# Patient Record
Sex: Male | Born: 1935
Health system: Southern US, Community
[De-identification: ages and names within clinical notes are randomized; demographics above are authoritative.]

## PROBLEM LIST (undated history)

## (undated) DIAGNOSIS — I5022 Chronic systolic (congestive) heart failure: Secondary | ICD-10-CM

## (undated) DIAGNOSIS — E079 Disorder of thyroid, unspecified: Secondary | ICD-10-CM

## (undated) DIAGNOSIS — I10 Essential (primary) hypertension: Secondary | ICD-10-CM

## (undated) DIAGNOSIS — N159 Renal tubulo-interstitial disease, unspecified: Secondary | ICD-10-CM

## (undated) HISTORY — PX: CHOLECYSTECTOMY: SHX55

---

## 2002-04-04 ENCOUNTER — Observation Stay (HOSPITAL_COMMUNITY): Admission: RE | Admit: 2002-04-04 | Discharge: 2002-04-06 | Payer: Self-pay | Admitting: General Surgery

## 2005-11-08 ENCOUNTER — Emergency Department (HOSPITAL_COMMUNITY): Admission: EM | Admit: 2005-11-08 | Discharge: 2005-11-08 | Payer: Self-pay | Admitting: Emergency Medicine

## 2013-05-28 ENCOUNTER — Inpatient Hospital Stay (HOSPITAL_COMMUNITY)
Admission: EM | Admit: 2013-05-28 | Discharge: 2013-06-02 | DRG: 871 | Disposition: A | Discharge status: Home Health | Payer: Medicare Other | Attending: Internal Medicine | Admitting: Internal Medicine

## 2013-05-28 ENCOUNTER — Encounter (HOSPITAL_COMMUNITY): Payer: Self-pay

## 2013-05-28 ENCOUNTER — Inpatient Hospital Stay (HOSPITAL_COMMUNITY): Payer: Medicare Other

## 2013-05-28 ENCOUNTER — Emergency Department (HOSPITAL_COMMUNITY): Payer: Medicare Other

## 2013-05-28 ENCOUNTER — Other Ambulatory Visit: Payer: Self-pay

## 2013-05-28 DIAGNOSIS — R17 Unspecified jaundice: Secondary | ICD-10-CM | POA: Diagnosis present

## 2013-05-28 DIAGNOSIS — N184 Chronic kidney disease, stage 4 (severe): Secondary | ICD-10-CM | POA: Diagnosis present

## 2013-05-28 DIAGNOSIS — E876 Hypokalemia: Secondary | ICD-10-CM | POA: Diagnosis not present

## 2013-05-28 DIAGNOSIS — E872 Acidosis, unspecified: Secondary | ICD-10-CM | POA: Diagnosis not present

## 2013-05-28 DIAGNOSIS — I4891 Unspecified atrial fibrillation: Secondary | ICD-10-CM | POA: Diagnosis present

## 2013-05-28 DIAGNOSIS — R1011 Right upper quadrant pain: Secondary | ICD-10-CM

## 2013-05-28 DIAGNOSIS — I079 Rheumatic tricuspid valve disease, unspecified: Secondary | ICD-10-CM | POA: Diagnosis present

## 2013-05-28 DIAGNOSIS — N179 Acute kidney failure, unspecified: Secondary | ICD-10-CM | POA: Diagnosis present

## 2013-05-28 DIAGNOSIS — B961 Klebsiella pneumoniae [K. pneumoniae] as the cause of diseases classified elsewhere: Secondary | ICD-10-CM | POA: Diagnosis present

## 2013-05-28 DIAGNOSIS — N390 Urinary tract infection, site not specified: Secondary | ICD-10-CM | POA: Diagnosis present

## 2013-05-28 DIAGNOSIS — K810 Acute cholecystitis: Secondary | ICD-10-CM

## 2013-05-28 DIAGNOSIS — D72829 Elevated white blood cell count, unspecified: Secondary | ICD-10-CM

## 2013-05-28 DIAGNOSIS — I129 Hypertensive chronic kidney disease with stage 1 through stage 4 chronic kidney disease, or unspecified chronic kidney disease: Secondary | ICD-10-CM | POA: Diagnosis present

## 2013-05-28 DIAGNOSIS — I452 Bifascicular block: Secondary | ICD-10-CM | POA: Diagnosis present

## 2013-05-28 DIAGNOSIS — A4150 Gram-negative sepsis, unspecified: Principal | ICD-10-CM | POA: Diagnosis present

## 2013-05-28 DIAGNOSIS — A419 Sepsis, unspecified organism: Secondary | ICD-10-CM | POA: Diagnosis present

## 2013-05-28 DIAGNOSIS — E039 Hypothyroidism, unspecified: Secondary | ICD-10-CM | POA: Diagnosis present

## 2013-05-28 DIAGNOSIS — I5022 Chronic systolic (congestive) heart failure: Secondary | ICD-10-CM | POA: Diagnosis present

## 2013-05-28 DIAGNOSIS — Z79899 Other long term (current) drug therapy: Secondary | ICD-10-CM

## 2013-05-28 DIAGNOSIS — I959 Hypotension, unspecified: Secondary | ICD-10-CM

## 2013-05-28 DIAGNOSIS — I10 Essential (primary) hypertension: Secondary | ICD-10-CM | POA: Diagnosis present

## 2013-05-28 DIAGNOSIS — K819 Cholecystitis, unspecified: Secondary | ICD-10-CM

## 2013-05-28 DIAGNOSIS — R7881 Bacteremia: Secondary | ICD-10-CM | POA: Diagnosis present

## 2013-05-28 HISTORY — DX: Disorder of thyroid, unspecified: E07.9

## 2013-05-28 HISTORY — DX: Essential (primary) hypertension: I10

## 2013-05-28 LAB — GLUCOSE, CAPILLARY
Glucose-Capillary: 115 mg/dL — ABNORMAL HIGH (ref 70–99)
Glucose-Capillary: 119 mg/dL — ABNORMAL HIGH (ref 70–99)

## 2013-05-28 LAB — CBC WITH DIFFERENTIAL/PLATELET
Basophils Absolute: 0 10*3/uL (ref 0.0–0.1)
Basophils Relative: 0 % (ref 0–1)
Eosinophils Relative: 0 % (ref 0–5)
Lymphocytes Relative: 2 % — ABNORMAL LOW (ref 12–46)
MCHC: 34.6 g/dL (ref 30.0–36.0)
MCV: 91.6 fL (ref 78.0–100.0)
Monocytes Absolute: 1.1 10*3/uL — ABNORMAL HIGH (ref 0.1–1.0)
Monocytes Relative: 7 % (ref 3–12)
Neutro Abs: 14.9 10*3/uL — ABNORMAL HIGH (ref 1.7–7.7)
Platelets: 214 10*3/uL (ref 150–400)
RDW: 13.9 % (ref 11.5–15.5)
WBC: 16.3 10*3/uL — ABNORMAL HIGH (ref 4.0–10.5)

## 2013-05-28 LAB — URINE MICROSCOPIC-ADD ON

## 2013-05-28 LAB — TYPE AND SCREEN: Antibody Screen: NEGATIVE

## 2013-05-28 LAB — COMPREHENSIVE METABOLIC PANEL
ALT: 36 U/L (ref 0–53)
AST: 65 U/L — ABNORMAL HIGH (ref 0–37)
Albumin: 2.9 g/dL — ABNORMAL LOW (ref 3.5–5.2)
BUN: 34 mg/dL — ABNORMAL HIGH (ref 6–23)
BUN: 33 mg/dL — ABNORMAL HIGH (ref 6–23)
CO2: 25 mEq/L (ref 19–32)
CO2: 16 mEq/L — ABNORMAL LOW (ref 19–32)
Calcium: 8.8 mg/dL (ref 8.4–10.5)
Calcium: 7.7 mg/dL — ABNORMAL LOW (ref 8.4–10.5)
Chloride: 104 mEq/L (ref 96–112)
Chloride: 106 mEq/L (ref 96–112)
Creatinine, Ser: 1.89 mg/dL — ABNORMAL HIGH (ref 0.50–1.35)
Creatinine, Ser: 1.46 mg/dL — ABNORMAL HIGH (ref 0.50–1.35)
GFR calc Af Amer: 47 mL/min — ABNORMAL LOW (ref >90)
GFR calc non Af Amer: 30 mL/min — ABNORMAL LOW (ref >90)
GFR calc non Af Amer: 41 mL/min — ABNORMAL LOW (ref >90)
Glucose, Bld: 106 mg/dL — ABNORMAL HIGH (ref 70–99)
Glucose, Bld: 121 mg/dL — ABNORMAL HIGH (ref 70–99)
Sodium: 138 mEq/L (ref 135–145)
Total Bilirubin: 2.6 mg/dL — ABNORMAL HIGH (ref 0.3–1.2)
Total Bilirubin: 2.1 mg/dL — ABNORMAL HIGH (ref 0.3–1.2)
Total Protein: 5.9 g/dL — ABNORMAL LOW (ref 6.0–8.3)

## 2013-05-28 LAB — URINALYSIS, ROUTINE W REFLEX MICROSCOPIC
Bilirubin Urine: NEGATIVE
Glucose, UA: NEGATIVE mg/dL
Nitrite: NEGATIVE
Specific Gravity, Urine: >1.03 — ABNORMAL HIGH (ref 1.005–1.030)
Urobilinogen, UA: 0.2 mg/dL (ref 0.0–1.0)
pH: 5.5 (ref 5.0–8.0)

## 2013-05-28 LAB — CBC
HCT: 35.8 % — ABNORMAL LOW (ref 39.0–52.0)
Hemoglobin: 12.5 g/dL — ABNORMAL LOW (ref 13.0–17.0)
MCH: 31.5 pg (ref 26.0–34.0)
MCV: 90.2 fL (ref 78.0–100.0)
RBC: 3.97 MIL/uL — ABNORMAL LOW (ref 4.22–5.81)
WBC: 27.1 10*3/uL — ABNORMAL HIGH (ref 4.0–10.5)

## 2013-05-28 LAB — MAGNESIUM: Magnesium: 1.4 mg/dL — ABNORMAL LOW (ref 1.5–2.5)

## 2013-05-28 LAB — TROPONIN I: Troponin I: <0.3 ng/mL (ref <0.30)

## 2013-05-28 LAB — ABO/RH: ABO/RH(D): B POS

## 2013-05-28 LAB — LACTIC ACID, PLASMA
Lactic Acid, Venous: 2.4 mmol/L — ABNORMAL HIGH (ref 0.5–2.2)
Lactic Acid, Venous: 1.7 mmol/L (ref 0.5–2.2)

## 2013-05-28 LAB — FIBRINOGEN: Fibrinogen: 585 mg/dL — ABNORMAL HIGH (ref 204–475)

## 2013-05-28 LAB — PRO B NATRIURETIC PEPTIDE: Pro B Natriuretic peptide (BNP): 5066 pg/mL — ABNORMAL HIGH (ref 0–450)

## 2013-05-28 MED ORDER — IOHEXOL 300 MG/ML  SOLN
50.0000 mL | Freq: Once | INTRAMUSCULAR | Status: AC | PRN
  Administered 2013-05-28: 30 mL via INTRAVENOUS

## 2013-05-28 MED ORDER — PIPERACILLIN-TAZOBACTAM 3.375 G IVPB
3.3750 g | Freq: Once | INTRAVENOUS | Status: AC
  Administered 2013-05-28: 3.375 g via INTRAVENOUS
  Filled 2013-05-28: qty 50

## 2013-05-28 MED ORDER — ALBUTEROL SULFATE (5 MG/ML) 0.5% IN NEBU: 2.5000 mg | INHALATION_SOLUTION | RESPIRATORY_TRACT | Status: DC | PRN

## 2013-05-28 MED ORDER — ACETAMINOPHEN 325 MG PO TABS
ORAL_TABLET | ORAL | Status: AC
  Filled 2013-05-28: qty 2

## 2013-05-28 MED ORDER — VANCOMYCIN HCL IN DEXTROSE 1-5 GM/200ML-% IV SOLN
1000.0000 mg | Freq: Once | INTRAVENOUS | Status: DC
  Filled 2013-05-28: qty 200

## 2013-05-28 MED ORDER — VANCOMYCIN HCL IN DEXTROSE 1-5 GM/200ML-% IV SOLN
1000.0000 mg | Freq: Once | INTRAVENOUS | Status: AC
  Administered 2013-05-28: 1000 mg via INTRAVENOUS
  Filled 2013-05-28: qty 200

## 2013-05-28 MED ORDER — NOREPINEPHRINE BITARTRATE 1 MG/ML IJ SOLN
2.0000 ug/min | INTRAVENOUS | Status: DC
  Administered 2013-05-28: 2 ug/min via INTRAVENOUS
  Filled 2013-05-28: qty 4

## 2013-05-28 MED ORDER — ACETAMINOPHEN 325 MG PO TABS: 325.0000 mg | ORAL_TABLET | ORAL | Status: DC | PRN

## 2013-05-28 MED ORDER — SODIUM CHLORIDE 0.9 % IV SOLN
1000.0000 mL | Freq: Once | INTRAVENOUS | Status: AC
  Administered 2013-05-28: 1000 mL via INTRAVENOUS

## 2013-05-28 MED ORDER — HYDROCORTISONE SOD SUCCINATE 100 MG IJ SOLR
50.0000 mg | Freq: Four times a day (QID) | INTRAMUSCULAR | Status: DC
  Administered 2013-05-28 – 2013-05-29 (×4): 50 mg via INTRAVENOUS
  Filled 2013-05-28 (×8): qty 1

## 2013-05-28 MED ORDER — FENTANYL CITRATE 0.05 MG/ML IJ SOLN
INTRAMUSCULAR | Status: AC | PRN
  Administered 2013-05-28 (×2): 12.5 ug via INTRAVENOUS

## 2013-05-28 MED ORDER — MIDAZOLAM HCL 2 MG/2ML IJ SOLN
INTRAMUSCULAR | Status: AC
  Filled 2013-05-28: qty 4

## 2013-05-28 MED ORDER — MIDAZOLAM HCL 2 MG/2ML IJ SOLN
INTRAMUSCULAR | Status: AC | PRN
  Administered 2013-05-28 (×2): 0.5 mg via INTRAVENOUS
  Administered 2013-05-28: 1 mg via INTRAVENOUS

## 2013-05-28 MED ORDER — PIPERACILLIN-TAZOBACTAM 3.375 G IVPB 30 MIN
3.3750 g | Freq: Once | INTRAVENOUS | Status: AC
  Administered 2013-05-28: 3.375 g via INTRAVENOUS
  Filled 2013-05-28: qty 50

## 2013-05-28 MED ORDER — PIPERACILLIN-TAZOBACTAM 3.375 G IVPB
3.3750 g | Freq: Three times a day (TID) | INTRAVENOUS | Status: DC
  Administered 2013-05-28 – 2013-05-29 (×2): 3.375 g via INTRAVENOUS
  Filled 2013-05-28 (×5): qty 50

## 2013-05-28 MED ORDER — DEXTROSE 5 % IV SOLN
5.0000 ug/min | INTRAVENOUS | Status: DC
  Filled 2013-05-28: qty 4

## 2013-05-28 MED ORDER — FENTANYL CITRATE 0.05 MG/ML IJ SOLN
INTRAMUSCULAR | Status: AC
  Filled 2013-05-28: qty 4

## 2013-05-28 MED ORDER — ACETAMINOPHEN 325 MG PO TABS
650.0000 mg | ORAL_TABLET | Freq: Once | ORAL | Status: AC
  Administered 2013-05-28: 650 mg via ORAL

## 2013-05-28 MED ORDER — VANCOMYCIN HCL 500 MG IV SOLR
500.0000 mg | INTRAVENOUS | Status: AC
  Administered 2013-05-28: 500 mg via INTRAVENOUS
  Filled 2013-05-28 (×2): qty 500

## 2013-05-28 MED ORDER — SODIUM CHLORIDE 0.9 % IV BOLUS (SEPSIS): 1000.0000 mL | INTRAVENOUS | Status: DC | PRN

## 2013-05-28 MED ORDER — SODIUM CHLORIDE 0.9 % IV SOLN
1000.0000 mL | INTRAVENOUS | Status: DC
  Administered 2013-05-28 – 2013-05-29 (×2): 1000 mL via INTRAVENOUS

## 2013-05-28 MED ORDER — FENTANYL CITRATE 0.05 MG/ML IJ SOLN
25.0000 ug | INTRAMUSCULAR | Status: DC | PRN
  Administered 2013-05-28: 25 ug via INTRAVENOUS
  Administered 2013-05-29: 50 ug via INTRAVENOUS
  Filled 2013-05-28 (×2): qty 2

## 2013-05-28 MED ORDER — SODIUM CHLORIDE 0.9 % IV BOLUS (SEPSIS)
2000.0000 mL | Freq: Once | INTRAVENOUS | Status: AC
  Administered 2013-05-28: 2000 mL via INTRAVENOUS

## 2013-05-28 MED ORDER — VANCOMYCIN HCL 10 G IV SOLR
1250.0000 mg | INTRAVENOUS | Status: DC
  Administered 2013-05-29: 1250 mg via INTRAVENOUS
  Filled 2013-05-28 (×2): qty 1250

## 2013-05-28 NOTE — H&P (Signed)
PULMONARY  / CRITICAL CARE MEDICINE  Name: Drew Reed MRN: [id number] DOB: 05-Nov-1919    ADMISSION DATE:  05/28/2013 CONSULTATION DATE:  05/28/2013  REFERRING MD :  Jola Schmidt PRIMARY SERVICE: Critical care  CHIEF COMPLAINT:  Weakness  BRIEF PATIENT DESCRIPTION: Drew Reed is a 79 y/o male transferred from [facility] for sepsis likely due to acute cholecystitis and UTI.  SIGNIFICANT EVENTS / STUDIES:  10/1- transferred to [facility] MICU 10/1 EKG with RBB and new-onset afib 10/1 Started on levophed  LINES / TUBES: L IJ 10/1>>> Foley catheter 10/1>>>  CULTURES: Blood Cx X 2 10/1>>> Urine Culture 10/1>>>  ANTIBIOTICS: Vancomycin 10/1>>> Zosyn 10/1>>>  HISTORY OF PRESENT ILLNESS:  Drew Reed was taken to the [facility] by EMS after 3-4 days of generalized weakness, fatigue, nausea, and abdominal pain. On his first BP by EMS he he had systolic BP of 79. In the ED he was found to have new onset Afib and a persistently low BP unresponsive to fluids. He was started on pressors and a CT abdomen found evidence of acute cholecystitis. He was felt to be a poor surgical candidate needing IR placement of a perc drainage so was transferred hear for higher level of care.   He denies chest pain, dyspnea,  Confusion, weakness, or syncope. He notes that he had chills last night. He takes several pills daily and has not taken them for 3-4 days due to not feeling well. He has been checking his BP daily and it has been lower than usual at the 100s/70s. He notes decreased appetite and a few episodes of non-bloody diarrhea.   PAST MEDICAL HISTORY :  Past Medical History  Diagnosis Date  . Hypertension   . Thyroid disease    History reviewed. No pertinent past surgical history. Prior to Admission medications   Medication Sig Start Date End Date Taking? Authorizing Provider  finasteride (PROSCAR) 5 MG tablet Take 5 mg by mouth daily.   Yes Historical Provider, MD  levothyroxine  (SYNTHROID, LEVOTHROID) 25 MCG tablet Take 25 mcg by mouth daily before breakfast.   Yes Historical Provider, MD  lisinopril (PRINIVIL,ZESTRIL) 5 MG tablet Take 2.5 mg by mouth daily.   Yes Historical Provider, MD  metoprolol tartrate (LOPRESSOR) 25 MG tablet Take 25 mg by mouth 2 (two) times daily. Hold if systolic pressure is less than 130.   Yes Historical Provider, MD  tamsulosin (FLOMAX) 0.4 MG CAPS capsule Take 0.4 mg by mouth daily.   Yes Historical Provider, MD   No Known Allergies  FAMILY HISTORY:  No family history on file. SOCIAL HISTORY:  reports that he has never smoked. He does not have any smokeless tobacco history on file. He reports that he does not drink alcohol or use illicit drugs.  REVIEW OF SYSTEMS:   Per HPI  SUBJECTIVE: Reports that besides the weakness he feels well, he is feeling better since presentation to teh ED this am.   VITAL SIGNS: Temp:  [98.2 F (36.8 C)-103.2 F (39.6 C)] 98.4 F (36.9 C) (10/01 1443) Pulse Rate:  [76-109] 79 (10/01 1332) Resp:  [12-32] 26 (10/01 1332) BP: (72-99)/(42-59) 80/57 mmHg (10/01 1450) SpO2:  [92 %-97 %] 94 % (10/01 1332) Weight:  [197 lb 5 oz (89.5 kg)] 197 lb 5 oz (89.5 kg) (10/01 1450) HEMODYNAMICS:    VENTILATOR SETTINGS: N/A   INTAKE / OUTPUT: Intake/Output     09/30 0701 - 10/01 0700 10/01 0701 - 10/02 0700   I.V. (  mL/kg)  6.9 (0.1)   Total Intake(mL/kg)  6.9 (0.1)   Net   +6.9          PHYSICAL EXAMINATION: General:  NAD, alert and interactive Neuro:  Strength 5/5 and sensation intact in all 4 extremities, CN 2-12 intact HEENT:  MMM Cardiovascular:  Irregularly irregular rhythm, no murmur, distant heart sounds as he is barrel chested Lungs:  CTAB Abdomen:  Soft, tender to palpation of RUQ and epigastrium, rebound tenderness in RUQ Musculoskeletal:  No edema, cool feet BL Skin:  Intact without evident lesions  LABS:  CBC Recent Labs     05/28/13  0958  WBC  16.3*  HGB  13.6  HCT  39.3  PLT   214   Coag's No results found for this basename: APTT, INR,  in the last 72 hours BMET Recent Labs     05/28/13  0958  NA  138  K  3.2*  CL  104  CO2  25  BUN  34*  CREATININE  1.89*  GLUCOSE  106*   Electrolytes Recent Labs     05/28/13  0958  CALCIUM  8.8   Sepsis Markers Recent Labs     05/28/13  0958  PROCALCITON  68.49   ABG No results found for this basename: PHART, PCO2ART, PO2ART,  in the last 72 hours Liver Enzymes Recent Labs     05/28/13  0958  AST  65*  ALT  36  ALKPHOS  84  BILITOT  2.6*  ALBUMIN  2.9*   Cardiac Enzymes Recent Labs     05/28/13  0958  TROPONINI  <0.30   Glucose No results found for this basename: GLUCAP,  in the last 72 hours  Imaging Ct Abdomen Pelvis Wo Contrast  05/28/2013   CLINICAL DATA:  Septate, weakness, fatigue, history hypertension  EXAM: CT ABDOMEN AND PELVIS WITHOUT CONTRAST  TECHNIQUE: Multidetector CT imaging of the abdomen and pelvis was performed following the standard protocol without intravenous contrast. Sagittal and coronal MPR images reconstructed from axial data set. Oral contrast not administered.  COMPARISON:  None  FINDINGS: Minimal bibasilar atelectasis.  Two right lung base nodules, 7 mm diameter image 10 and 11 mm diameter image 11.  Distended gallbladder with thickened wall and pericholecystic edema highly suspicious for acute cholecystitis.  No definite biliary tract calcifications identified.  Calcified granulomata within spleen.  Tiny calcified granuloma at upper pole right kidney image 23.  Within limits of a nonenhanced exam, no additional focal abnormalities of the liver, spleen, pancreas, kidneys, or adrenal glands otherwise identified.  Supraumbilical ventral hernia containing fat.  Right inguinal hernia containing nonobstructed segment of sigmoid colon.  Scattered mild colonic diverticulosis distally.  Atherosclerotic calcifications including coronary arteries.  Stomach and bowel loops otherwise  normal appearance.  Bladder decompressed by Foley catheter with enlarged prostate gland noted.  No mass, adenopathy, free fluid, or free air.  Bones demineralized.  IMPRESSION: Distended gallbladder with thickened gallbladder wall and pericholecystic edema highly suspicious for acute cholecystitis.  Right inguinal hernia containing a nonobstructed segment of sigmoid colon.  Supraumbilical ventral hernia containing fat.  Nonobstructing right renal calculus.  Two nonspecific nodules are seen at the right lung base, 7 mm and 11 mm in size, the metastatic disease not excluded with this appearance; recommend followup CT imaging of the chest when the patient's condition permits.   Electronically Signed   By: Lavonia Dana M.D.   On: 05/28/2013 12:43   Dg Chest [facility]  05/28/2013   CLINICAL DATA:  Fatigue, weakness  EXAM: PORTABLE CHEST - 1 VIEW  COMPARISON:  None.  FINDINGS: Left lung base is poorly visualized. A left basilar opacity is not excluded.  No frank interstitial edema. No pneumothorax.  Cardiomegaly.  IMPRESSION: Left lung base is poorly visualized. A left basilar opacity is not excluded. Consider PA/ lateral chest radiographs further evaluation.   Electronically Signed   By: Julian Hy M.D.   On: 05/28/2013 11:20   Dg Chest Port 1v Same Day  05/28/2013   CLINICAL DATA:  Central line placement  EXAM: PORTABLE CHEST - 1 VIEW SAME DAY  COMPARISON:  Portable exam 1252 hr compared to 1107 hr.  FINDINGS: New left subclavian central venous catheter with tip projecting over SVC.  Enlargement of cardiac silhouette.  Atherosclerotic calcification aorta.  Chronic accentuation of interstitial markings in both lungs.  Cannot exclude bibasilar infiltrates.  No gross pleural effusion or pneumothorax.  IMPRESSION: Enlargement of cardiac silhouette.  No pneumothorax following central line placement.  Bibasilar opacities cannot exclude infiltrate.   Electronically Signed   By: Lavonia Dana M.D.   On: 05/28/2013  13:20   ASSESSMENT / PLAN:  PULMONARY A: With barrel chest consider COPD- emphysema, but no former diagnosis P:   - O2 via [place] to maintain sats above 88%  CARDIOVASCULAR A:  New onset Afib, rate controlled Hypotension 2/2 sepsis P:  - Levophed drip - monitor CVPs - Cycle troponins, repeat EKG in the am - will need anticoagulation but will defer for now for possible surgery vs IR  - Hold home BB and ACEi  RENAL A:   Hx of renal failure needing HD after obstructing stone Likely pre-renal AKI on CKD, but baseline GFR unknown Volume depleted P:   - Bolus now, NS at 125 mL/hr - trend creatinine - avoid nephrotoxins - Follow CVP.  GASTROINTESTINAL A:   Acute cholecystitis  P:   - General surgery and IR consulted - NPO for intervention  HEMATOLOGIC A:  No problems P:  - Check PT/INR for intervention - Needs anticoagulation as above, will defer - Trend cbc/plts  INFECTIOUS A:   Acute cholecystitis Likely UTI P:   - Vanc and zosyn per pharm - Blood and urine cultures pending  ENDOCRINE A:   No known problems P:   - Check cortisol - Solu cortif X 1  NEUROLOGIC A:   No known problems P:   - monitor mental status with sepsis  TODAY'S SUMMARY: Will await General Surgery's opinion if he is a surgical candidate and coordinate with IR accordingly. Continue IV antibiotics and pressors, rule out ACS with troponins and repeat EKG considering new onset Afib.   Laroy Apple, MD [place] Resident, PGY-2 05/28/2013, 3:53 PM  UTI and cholecystitis, septic shock, protecting airway, surgery and IR involved, will defer management for cholecystitis to surgery and IR.  Will hold in the ICU for pressor support.  CC time 45 minutes.  Patient seen and examined, agree with above note.  I dictated the care and orders written for this patient under my direction.  Rush Farmer, MD [phone]

## 2013-05-28 NOTE — Consult Note (Signed)
Acute cholecystitis - marginal surgical candidate. Patient prefers non-operative approach - perc drain.   Will continue to follow with you.  Drew Reed. Georgette Dover, MD, [facility] Surgery  General/ Trauma Surgery  05/28/2013 5:23 PM

## 2013-05-28 NOTE — ED Provider Notes (Addendum)
CSN: [id number]     Arrival date & time 05/28/13  [id number] History  This chart was scribed for Hoy Morn, MD by Roxan Diesel, ED scribe.  This patient was seen in room APA14/APA14 and the patient's care was started at 9:32 AM.  Chief Complaint  Patient presents with  . Fatigue  . Weakness    The history is provided by the patient and the EMS personnel. No language interpreter was used.    HPI Comments: Drew Reed is a 79 y.o. male with h/o HTN and thyroid disease brought in by EMS to the Emergency Department complaining of 3-4 days of generalized weakness with associated decreased appetite, fatigue, hypotension, diarrhea, and mild abdominal pain.  Pt states "I haven't felt good in 3-4 days."  He reports he has not been eating or drinking because he has not been thirsty and has had no appetite.  He states he has also had some non-bloody, non-bilious diarrhea.  In addition he complains of mild epigastric soreness.  He also notes he felt nauseated on one occasion several days ago but did not vomit.  He also complains of decreased urination.  Pt's initial BP was 79 systolic per EMS.  He was administered 1 liter of fluid and on evaluation BP is 99/52.  EMS also reported that pt was in A-fib on monitor.  Pt denies CP or SOB.  Family denies pt having h/o A-fib.  Pt's wife reports that he is normally healthy and active at baseline.  She also reports that he has h/o "kidney infection" and kidney stones and has been on dialysis due to urinary retention in the past.  In addition she notes a h/o prostate surgery and thyroid surgery.      Past Medical History  Diagnosis Date  . Hypertension   . Thyroid disease     History reviewed. No pertinent past surgical history.  No family history on file.  History  Substance Use Topics  . Smoking status: Never Smoker   . Smokeless tobacco: Not on file  . Alcohol Use: No     Review of Systems A complete 10 system review of systems was obtained  and all systems are negative except as noted in the HPI and PMH.    Allergies  Review of patient's allergies indicates no known allergies.  Home Medications  No current outpatient prescriptions on file.  BP 99/52  Pulse 109  Temp(Src) 98.2 F (36.8 C) (Oral)  Resp 17  SpO2 94%  Physical Exam  Nursing note and vitals reviewed. Constitutional: He is oriented to person, place, and time. He appears well-developed and well-nourished.  HENT:  Head: Normocephalic and atraumatic.  Eyes: EOM are normal.  Neck: Normal range of motion.  Cardiovascular: Normal heart sounds and intact distal pulses.  An irregularly irregular rhythm present. Tachycardia present.   Pulmonary/Chest: Effort normal and breath sounds normal. No respiratory distress.  Abdominal: Soft. He exhibits no distension.  Mild epigastric discomfort Easily reducible periumbilical hernia  Musculoskeletal: Normal range of motion.  Neurological: He is alert and oriented to person, place, and time.  Skin: Skin is warm and dry.  Psychiatric: He has a normal mood and affect. Judgment normal.    ED Course  Procedures (including critical care time)    DIAGNOSTIC STUDIES: Oxygen Saturation is 94% on room air, adeqyate by my interpretation.    COORDINATION OF CARE: 9:40 AM: Discussed treatment plan which includes IV fluids, labs and CXR with pt at bedside and pt  agrees to plan.   10:50 AM: On recheck pt's temperature has risen to 103.2 F.  Discussed treatment plan with pt's wife which includes labs, CT abdomen, antibiotics, and admission.  She expressed understanding and agreed with plan.  10:58 AM: On recheck BP is 81/42.  Liters 3 and 4 infusing at this time.  Antibiotics are in 4.  Code status discussed with wife.  At this time full code.  Portable CXR being obtained now.  Difficulty obtaining urine sample.  Nursing to attempt coude catheter.  11:31 AM: On recheck BP is 90 systolic.  ECG interpretation  Date:  05/28/2013  Rate: 90  Rhythm: Atrial fibrillation with controlled rate  QRS Axis: normal  Intervals: normal  ST/T Wave abnormalities: normal  Conduction Disutrbances: none  Narrative Interpretation: PVC  Old EKG Reviewed: no prior ecg, no prior history of atrial fibrillation    CENTRAL LINE Performed by: Hoy Morn Consent: The procedure was performed in an emergent situation. Required items: required blood products, implants, devices, and special equipment available Patient identity confirmed: arm band and provided demographic data Time out: Immediately prior to procedure a "time out" was called to verify the correct patient, procedure, equipment, support staff and site/side marked as required. Indications: vascular access Anesthesia: local infiltration Local anesthetic: lidocaine 1% with epinephrine Anesthetic total: 3 ml Patient sedated: no Preparation: skin prepped with 2% chlorhexidine Skin prep agent dried: skin prep agent completely dried prior to procedure Sterile barriers: all five maximum sterile barriers used - cap, mask, sterile gown, sterile gloves, and large sterile sheet Hand hygiene: hand hygiene performed prior to central venous catheter insertion Location details: left subclavian Catheter type: triple lumen Catheter size: 8 Fr Pre-procedure: landmarks identified Ultrasound guidance: no Successful placement: yes Post-procedure: line sutured and dressing applied Assessment: blood return through all parts, free fluid flow, placement verified by x-ray and no pneumothorax on x-ray Patient tolerance: Patient tolerated the procedure well with no immediate complications.  CRITICAL CARE Performed by: Hoy Morn Total critical care time: 45 Critical care time was exclusive of separately billable procedures and treating other patients. Critical care was necessary to treat or prevent imminent or life-threatening deterioration. Critical care was time spent  personally by me on the following activities: development of treatment plan with patient and/or surrogate as well as nursing, discussions with consultants, evaluation of patient's response to treatment, examination of patient, obtaining history from patient or surrogate, ordering and performing treatments and interventions, ordering and review of laboratory studies, ordering and review of radiographic studies, pulse oximetry and re-evaluation of patient's condition.    Labs Review Labs Reviewed  CBC WITH DIFFERENTIAL - Abnormal; Notable for the following:    WBC 16.3 (*)    Neutrophils Relative % 91 (*)    Neutro Abs 14.9 (*)    Lymphocytes Relative 2 (*)    Lymphs Abs 0.4 (*)    Monocytes Absolute 1.1 (*)    All other components within normal limits  COMPREHENSIVE METABOLIC PANEL - Abnormal; Notable for the following:    Potassium 3.2 (*)    Glucose, Bld 106 (*)    BUN 34 (*)    Creatinine, Ser 1.89 (*)    Total Protein 5.9 (*)    Albumin 2.9 (*)    AST 65 (*)    Total Bilirubin 2.6 (*)    GFR calc non Af Amer 30 (*)    GFR calc Af Amer 34 (*)    All other components within normal limits  URINALYSIS,  ROUTINE W REFLEX MICROSCOPIC - Abnormal; Notable for the following:    Specific Gravity, Urine >1.030 (*)    Hgb urine dipstick MODERATE (*)    Protein, ur TRACE (*)    Leukocytes, UA TRACE (*)    All other components within normal limits  LACTIC ACID, PLASMA - Abnormal; Notable for the following:    Lactic Acid, Venous 2.4 (*)    All other components within normal limits  URINE MICROSCOPIC-ADD ON - Abnormal; Notable for the following:    Bacteria, UA MANY (*)    All other components within normal limits  CULTURE, BLOOD (ROUTINE X 2)  CULTURE, BLOOD (ROUTINE X 2)  URINE CULTURE  PROCALCITONIN  TROPONIN I     Imaging Review Ct Abdomen Pelvis Wo Contrast  05/28/2013   CLINICAL DATA:  Septate, weakness, fatigue, history hypertension  EXAM: CT ABDOMEN AND PELVIS WITHOUT  CONTRAST  TECHNIQUE: Multidetector CT imaging of the abdomen and pelvis was performed following the standard protocol without intravenous contrast. Sagittal and coronal MPR images reconstructed from axial data set. Oral contrast not administered.  COMPARISON:  None  FINDINGS: Minimal bibasilar atelectasis.  Two right lung base nodules, 7 mm diameter image 10 and 11 mm diameter image 11.  Distended gallbladder with thickened wall and pericholecystic edema highly suspicious for acute cholecystitis.  No definite biliary tract calcifications identified.  Calcified granulomata within spleen.  Tiny calcified granuloma at upper pole right kidney image 23.  Within limits of a nonenhanced exam, no additional focal abnormalities of the liver, spleen, pancreas, kidneys, or adrenal glands otherwise identified.  Supraumbilical ventral hernia containing fat.  Right inguinal hernia containing nonobstructed segment of sigmoid colon.  Scattered mild colonic diverticulosis distally.  Atherosclerotic calcifications including coronary arteries.  Stomach and bowel loops otherwise normal appearance.  Bladder decompressed by Foley catheter with enlarged prostate gland noted.  No mass, adenopathy, free fluid, or free air.  Bones demineralized.  IMPRESSION: Distended gallbladder with thickened gallbladder wall and pericholecystic edema highly suspicious for acute cholecystitis.  Right inguinal hernia containing a nonobstructed segment of sigmoid colon.  Supraumbilical ventral hernia containing fat.  Nonobstructing right renal calculus.  Two nonspecific nodules are seen at the right lung base, 7 mm and 11 mm in size, the metastatic disease not excluded with this appearance; recommend followup CT imaging of the chest when the patient's condition permits.   Electronically Signed   By: Lavonia Dana M.D.   On: 05/28/2013 12:43   Dg Chest Port 1 View  05/28/2013   CLINICAL DATA:  Fatigue, weakness  EXAM: PORTABLE CHEST - 1 VIEW  COMPARISON:   None.  FINDINGS: Left lung base is poorly visualized. A left basilar opacity is not excluded.  No frank interstitial edema. No pneumothorax.  Cardiomegaly.  IMPRESSION: Left lung base is poorly visualized. A left basilar opacity is not excluded. Consider PA/ lateral chest radiographs further evaluation.   Electronically Signed   By: Julian Hy M.D.   On: 05/28/2013 11:20   I personally reviewed the imaging tests through PACS system I reviewed available ER/hospitalization records through the EMR   MDM   1. Septic shock   2. Hypotension   3. Urinary tract infection   4. Cholecystitis    Presenting complaint cocerning for sepsis with rectal temp of 103. Cultures, labs, cxr pending. Mild RUQ abdominal pain. CT without contrast will need to be performed given renal insufficiency. Fluid bolus now. Abx. Will manage aggressively. Discussed with wife and daughter, pt full  code. Functional and lives at home.  New-onset atrial fibrillation.   12:52 PM Spoke with Dr Nelda Marseille who accepts the pt in transfer to ICU at [facility]. Suspect abdominal sepsis with acute cholecystitis. I believe the pt would benefit from biliary drain. Unable to obtain IR biliary drain at this facility. Not a good OR candidate given shock. Source control necessary in abdominal sepsis. vanc and zosyn given. Left subclavian central line placed for access, and IV norepi. . Started on norepi.  New-onset atrial fibrillation will need to be worked up further, likely secondary to sepsis.  Troponin normal EKG with bifascicular block but no significant abnormal ST segments   I personally performed the services described in this documentation, which was scribed in my presence. The recorded information has been reviewed and is accurate.      Hoy Morn, MD 05/28/13 [place], MD 05/28/13 [phone]

## 2013-05-28 NOTE — H&P (Signed)
Referring Physician: Dr. Nelda Marseille HPI: Drew Reed is an 79 y.o. male who presented today as a transfer from [facility] with septic shock secondary to acute cholecystitis, patient also found to have UTI. The patient c/o RUQ pain, lack of appetite x 4 days and feeling fatigue. He also c/o diarrhea non-bloody. He denies any vomiting, blood in his stool. He does admit to blood in his urine and decreased urination, he has a history of kidney stones and urinary retention with history of dialysis in the past. The patient was also found to have afib controlled rates upon admission with no known previous history. He denies any chest pain or shortness of breath. The patient upon admission temp 103.76F and wbc 16.3 rising to now 27.1  He has had BP in the 70's and is currently on levophed. He also has been placed on IV Zosyn and vancomycin.  Past Medical History:  Past Medical History  Diagnosis Date  . Hypertension   . Thyroid disease     Past Surgical History: History reviewed. No pertinent past surgical history.  Family History: No family history on file.  Social History:  reports that he has never smoked. He does not have any smokeless tobacco history on file. He reports that he does not drink alcohol or use illicit drugs.  Allergies: No Known Allergies    Medication List    ASK your doctor about these medications       finasteride 5 MG tablet  Commonly known as:  PROSCAR  Take 5 mg by mouth daily.     levothyroxine 25 MCG tablet  Commonly known as:  SYNTHROID, LEVOTHROID  Take 25 mcg by mouth daily before breakfast.     lisinopril 5 MG tablet  Commonly known as:  PRINIVIL,ZESTRIL  Take 2.5 mg by mouth daily.     metoprolol tartrate 25 MG tablet  Commonly known as:  LOPRESSOR  Take 25 mg by mouth 2 (two) times daily. Hold if systolic pressure is less than 130.     tamsulosin 0.4 MG Caps capsule  Commonly known as:  FLOMAX  Take 0.4 mg by mouth daily.       Please HPI for pertinent  positives, otherwise complete 10 system ROS negative.  Physical Exam: BP 97/59  Pulse 78  Temp(Src) 98.4 F (36.9 C) (Oral)  Resp 24  Ht 6\' 3"  (1.905 m)  Wt 197 lb 5 oz (89.5 kg)  BMI 24.66 kg/m2  SpO2 98% Body mass index is 24.66 kg/(m^2).   General Appearance:  Alert, cooperative, no distress  Head:  Normocephalic, without obvious abnormality, atraumatic  ENT: Unremarkable  Neck: Supple, symmetrical, trachea midline  Lungs:   Clear to auscultation bilaterally, no w/r/r, respirations unlabored without use of accessory muscles.  Chest Wall:  No tenderness or deformity  Heart:  Irregularly irregular rate and rhythm, S1, S2 normal, no murmur, rub or gallop.  Abdomen:   Soft, non-tender, non distended.  Extremities: Extremities normal, atraumatic, no cyanosis or edema  Pulses: 1+ and symmetric  Neurologic: Normal affect, no gross deficits.   Results for orders placed during the hospital encounter of 05/28/13 (from the past 48 hour(s))  CULTURE, BLOOD (ROUTINE X 2)     Status: None   Collection Time    05/28/13  9:57 AM      Result Value Range   Specimen Description Blood     Special Requests NONE     Culture NO GROWTH <24 HRS     Report Status  PENDING    CBC WITH DIFFERENTIAL     Status: Abnormal   Collection Time    05/28/13  9:58 AM      Result Value Range   WBC 16.3 (*) 4.0 - 10.5 K/uL   RBC 4.29  4.22 - 5.81 MIL/uL   Hemoglobin 13.6  13.0 - 17.0 g/dL   HCT 39.3  39.0 - 52.0 %   MCV 91.6  78.0 - 100.0 fL   MCH 31.7  26.0 - 34.0 pg   MCHC 34.6  30.0 - 36.0 g/dL   RDW 13.9  11.5 - 15.5 %   Platelets 214  150 - 400 K/uL   Neutrophils Relative % 91 (*) 43 - 77 %   Neutro Abs 14.9 (*) 1.7 - 7.7 K/uL   Lymphocytes Relative 2 (*) 12 - 46 %   Lymphs Abs 0.4 (*) 0.7 - 4.0 K/uL   Monocytes Relative 7  3 - 12 %   Monocytes Absolute 1.1 (*) 0.1 - 1.0 K/uL   Eosinophils Relative 0  0 - 5 %   Eosinophils Absolute 0.0  0.0 - 0.7 K/uL   Basophils Relative 0  0 - 1 %    Basophils Absolute 0.0  0.0 - 0.1 K/uL  COMPREHENSIVE METABOLIC PANEL     Status: Abnormal   Collection Time    05/28/13  9:58 AM      Result Value Range   Sodium 138  135 - 145 mEq/L   Potassium 3.2 (*) 3.5 - 5.1 mEq/L   Chloride 104  96 - 112 mEq/L   CO2 25  19 - 32 mEq/L   Glucose, Bld 106 (*) 70 - 99 mg/dL   BUN 34 (*) 6 - 23 mg/dL   Creatinine, Ser 1.89 (*) 0.50 - 1.35 mg/dL   Calcium 8.8  8.4 - 10.5 mg/dL   Total Protein 5.9 (*) 6.0 - 8.3 g/dL   Albumin 2.9 (*) 3.5 - 5.2 g/dL   AST 65 (*) 0 - 37 U/L   ALT 36  0 - 53 U/L   Alkaline Phosphatase 84  39 - 117 U/L   Total Bilirubin 2.6 (*) 0.3 - 1.2 mg/dL   GFR calc non Af Amer 30 (*) >90 mL/min   GFR calc Af Amer 34 (*) >90 mL/min   Comment: (NOTE)     The eGFR has been calculated using the CKD EPI equation.     This calculation has not been validated in all clinical situations.     eGFR's persistently <90 mL/min signify possible Chronic Kidney     Disease.  PROCALCITONIN     Status: None   Collection Time    05/28/13  9:58 AM      Result Value Range   Procalcitonin 68.49     Comment:            Interpretation:     PCT >= 10 ng/mL:     Important systemic inflammatory response,     almost exclusively due to severe bacterial     sepsis or septic shock.     (NOTE)             ICU PCT Algorithm               Non ICU PCT Algorithm        ----------------------------     ------------------------------             PCT < 0.25 ng/mL  PCT < 0.1 ng/mL         Stopping of antibiotics            Stopping of antibiotics           strongly encouraged.               strongly encouraged.        ----------------------------     ------------------------------           PCT level decrease by               PCT < 0.25 ng/mL           >= 80% from peak PCT           OR PCT 0.25 - 0.5 ng/mL          Stopping of antibiotics                                                 encouraged.         Stopping of antibiotics                encouraged.        ----------------------------     ------------------------------           PCT level decrease by              PCT >= 0.25 ng/mL           < 80% from peak PCT            AND PCT >= 0.5 ng/mL            Continuing antibiotics                                                  encouraged.           Continuing antibiotics                encouraged.        ----------------------------     ------------------------------         PCT level increase compared          PCT > 0.5 ng/mL             with peak PCT AND              PCT >= 0.5 ng/mL             Escalation of antibiotics                                              strongly encouraged.          Escalation of antibiotics            strongly encouraged.  TROPONIN I     Status: None   Collection Time    05/28/13  9:58 AM      Result Value Range   Troponin I <0.30  <0.30 ng/mL   Comment:            Due to the release kinetics  of cTnI,     a negative result within the first hours     of the onset of symptoms does not rule out     myocardial infarction with certainty.     If myocardial infarction is still suspected,     repeat the test at appropriate intervals.  LACTIC ACID, PLASMA     Status: Abnormal   Collection Time    05/28/13  9:58 AM      Result Value Range   Lactic Acid, Venous 2.4 (*) 0.5 - 2.2 mmol/L  CULTURE, BLOOD (ROUTINE X 2)     Status: None   Collection Time    05/28/13 10:04 AM      Result Value Range   Specimen Description Blood     Special Requests NONE     Culture NO GROWTH <24 HRS     Report Status PENDING    URINALYSIS, ROUTINE W REFLEX MICROSCOPIC     Status: Abnormal   Collection Time    05/28/13 11:25 AM      Result Value Range   Color, Urine YELLOW  YELLOW   APPearance CLEAR  CLEAR   Specific Gravity, Urine >1.030 (*) 1.005 - 1.030   pH 5.5  5.0 - 8.0   Glucose, UA NEGATIVE  NEGATIVE mg/dL   Hgb urine dipstick MODERATE (*) NEGATIVE   Bilirubin Urine NEGATIVE  NEGATIVE   Ketones,  ur NEGATIVE  NEGATIVE mg/dL   Protein, ur TRACE (*) NEGATIVE mg/dL   Urobilinogen, UA 0.2  0.0 - 1.0 mg/dL   Nitrite NEGATIVE  NEGATIVE   Leukocytes, UA TRACE (*) NEGATIVE  URINE MICROSCOPIC-ADD ON     Status: Abnormal   Collection Time    05/28/13 11:25 AM      Result Value Range   WBC, UA 3-6  <3 WBC/hpf   RBC / HPF 7-10  <3 RBC/hpf   Bacteria, UA MANY (*) RARE  CBC     Status: Abnormal   Collection Time    05/28/13  3:35 PM      Result Value Range   WBC 27.1 (*) 4.0 - 10.5 K/uL   RBC 3.97 (*) 4.22 - 5.81 MIL/uL   Hemoglobin 12.5 (*) 13.0 - 17.0 g/dL   HCT 35.8 (*) 39.0 - 52.0 %   MCV 90.2  78.0 - 100.0 fL   MCH 31.5  26.0 - 34.0 pg   MCHC 34.9  30.0 - 36.0 g/dL   RDW 14.1  11.5 - 15.5 %   Platelets 222  150 - 400 K/uL  PROTIME-INR     Status: Abnormal   Collection Time    05/28/13  3:35 PM      Result Value Range   Prothrombin Time 17.8 (*) 11.6 - 15.2 seconds   INR 1.51 (*) 0.00 - 1.49  APTT     Status: None   Collection Time    05/28/13  3:35 PM      Result Value Range   aPTT 31  24 - 37 seconds  FIBRINOGEN     Status: Abnormal   Collection Time    05/28/13  3:35 PM      Result Value Range   Fibrinogen 585 (*) 204 - 475 mg/dL  TYPE AND SCREEN     Status: None   Collection Time    05/28/13  3:35 PM      Result Value Range   ABO/RH(D) B POS     Antibody Screen PENDING  Sample Expiration 05/31/2013    GLUCOSE, CAPILLARY     Status: Abnormal   Collection Time    05/28/13  3:42 PM      Result Value Range   Glucose-Capillary 115 (*) 70 - 99 mg/dL  TROPONIN I     Status: None   Collection Time    05/28/13  3:52 PM      Result Value Range   Troponin I <0.30  <0.30 ng/mL   Comment:            Due to the release kinetics of cTnI,     a negative result within the first hours     of the onset of symptoms does not rule out     myocardial infarction with certainty.     If myocardial infarction is still suspected,     repeat the test at appropriate intervals.   PRO B NATRIURETIC PEPTIDE     Status: Abnormal   Collection Time    05/28/13  3:52 PM      Result Value Range   Pro B Natriuretic peptide (BNP) 5066.0 (*) 0 - 450 pg/mL   Ct Abdomen Pelvis Wo Contrast  05/28/2013   CLINICAL DATA:  Septate, weakness, fatigue, history hypertension  EXAM: CT ABDOMEN AND PELVIS WITHOUT CONTRAST  TECHNIQUE: Multidetector CT imaging of the abdomen and pelvis was performed following the standard protocol without intravenous contrast. Sagittal and coronal MPR images reconstructed from axial data set. Oral contrast not administered.  COMPARISON:  None  FINDINGS: Minimal bibasilar atelectasis.  Two right lung base nodules, 7 mm diameter image 10 and 11 mm diameter image 11.  Distended gallbladder with thickened wall and pericholecystic edema highly suspicious for acute cholecystitis.  No definite biliary tract calcifications identified.  Calcified granulomata within spleen.  Tiny calcified granuloma at upper pole right kidney image 23.  Within limits of a nonenhanced exam, no additional focal abnormalities of the liver, spleen, pancreas, kidneys, or adrenal glands otherwise identified.  Supraumbilical ventral hernia containing fat.  Right inguinal hernia containing nonobstructed segment of sigmoid colon.  Scattered mild colonic diverticulosis distally.  Atherosclerotic calcifications including coronary arteries.  Stomach and bowel loops otherwise normal appearance.  Bladder decompressed by Foley catheter with enlarged prostate gland noted.  No mass, adenopathy, free fluid, or free air.  Bones demineralized.  IMPRESSION: Distended gallbladder with thickened gallbladder wall and pericholecystic edema highly suspicious for acute cholecystitis.  Right inguinal hernia containing a nonobstructed segment of sigmoid colon.  Supraumbilical ventral hernia containing fat.  Nonobstructing right renal calculus.  Two nonspecific nodules are seen at the right lung base, 7 mm and 11 mm in size, the  metastatic disease not excluded with this appearance; recommend followup CT imaging of the chest when the patient's condition permits.   Electronically Signed   By: Lavonia Dana M.D.   On: 05/28/2013 12:43   Dg Chest Port 1 View  05/28/2013   CLINICAL DATA:  Fatigue, weakness  EXAM: PORTABLE CHEST - 1 VIEW  COMPARISON:  None.  FINDINGS: Left lung base is poorly visualized. A left basilar opacity is not excluded.  No frank interstitial edema. No pneumothorax.  Cardiomegaly.  IMPRESSION: Left lung base is poorly visualized. A left basilar opacity is not excluded. Consider PA/ lateral chest radiographs further evaluation.   Electronically Signed   By: Julian Hy M.D.   On: 05/28/2013 11:20   Dg Chest Port 1v Same Day  05/28/2013   CLINICAL DATA:  Central line placement  EXAM: PORTABLE CHEST - 1 VIEW SAME DAY  COMPARISON:  Portable exam 1252 hr compared to 1107 hr.  FINDINGS: New left subclavian central venous catheter with tip projecting over SVC.  Enlargement of cardiac silhouette.  Atherosclerotic calcification aorta.  Chronic accentuation of interstitial markings in both lungs.  Cannot exclude bibasilar infiltrates.  No gross pleural effusion or pneumothorax.  IMPRESSION: Enlargement of cardiac silhouette.  No pneumothorax following central line placement.  Bibasilar opacities cannot exclude infiltrate.   Electronically Signed   By: Lavonia Dana M.D.   On: 05/28/2013 13:20    Assessment/Plan Septic shock. Hypotension, patient on levophed. Acute cholecystitis wbc 27.1, temp 103.67F Atrial fibrillation with controlled rates, new diagnosis unknown onset. UTI, history of kidney stones and urinary retention requiring dialysis.  Request for emergent image guided percutaneous cholecystostomy tube placement. Labs reviewed, patient has been NPO. Risks and Benefits discussed with the patient and his family. All of the patient's questions were answered, patient is agreeable to proceed. Consent signed and  in chart.   Tsosie Billing D PA-C 05/28/2013, 4:47 PM

## 2013-05-28 NOTE — Progress Notes (Signed)
ANTIBIOTIC CONSULT NOTE - INITIAL  Pharmacy Consult for Vancomycin and zosyn Indication: Sepsis  No Known Allergies  Patient Measurements: Height: 6\' 3"  (190.5 cm) Weight: 197 lb 5 oz (89.5 kg) IBW/kg (Calculated) : 84.5   Vital Signs: Temp: 98.4 F (36.9 C) (10/01 1556) Temp src: Oral (10/01 1556) BP: 97/59 mmHg (10/01 1500) Pulse Rate: 78 (10/01 1500) Intake/Output from previous day:   Intake/Output from this shift: Total I/O In: 21.9 [I.V.:21.9] Out: -   Labs:  Recent Labs  05/28/13 0958 05/28/13 1535  WBC 16.3* 27.1*  HGB 13.6 12.5*  PLT 214 222  CREATININE 1.89*  --    Estimated Creatinine Clearance: 31 ml/min (by C-G formula based on Cr of 1.89). No results found for this basename: VANCOTROUGH, Corlis Leak, VANCORANDOM, [place], GENTPEAK, GENTRANDOM, Energy, TOBRAPEAK, TOBRARND, AMIKACINPEAK, AMIKACINTROU, AMIKACIN,  in the last 72 hours   Microbiology: Recent Results (from the past 720 hour(s))  CULTURE, BLOOD (ROUTINE X 2)     Status: None   Collection Time    05/28/13  9:57 AM      Result Value Range Status   Specimen Description Blood   Final   Special Requests NONE   Final   Culture NO GROWTH <24 HRS   Final   Report Status PENDING   Incomplete  CULTURE, BLOOD (ROUTINE X 2)     Status: None   Collection Time    05/28/13 10:04 AM      Result Value Range Status   Specimen Description Blood   Final   Special Requests NONE   Final   Culture NO GROWTH <24 HRS   Final   Report Status PENDING   Incomplete    Medical History: Past Medical History  Diagnosis Date  . Hypertension   . Thyroid disease     Medications:  Prescriptions prior to admission  Medication Sig Dispense Refill  . finasteride (PROSCAR) 5 MG tablet Take 5 mg by mouth daily.      Marland Kitchen levothyroxine (SYNTHROID, LEVOTHROID) 25 MCG tablet Take 25 mcg by mouth daily before breakfast.      . lisinopril (PRINIVIL,ZESTRIL) 5 MG tablet Take 2.5 mg by mouth daily.      . metoprolol  tartrate (LOPRESSOR) 25 MG tablet Take 25 mg by mouth 2 (two) times daily. Hold if systolic pressure is less than 130.      . tamsulosin (FLOMAX) 0.4 MG CAPS capsule Take 0.4 mg by mouth daily.       Scheduled:  . hydrocortisone sodium succinate  50 mg Intravenous Q6H   Assessment: 79 y.o male transferred from [facility] for sepsis likely due to acute cholecystitis and UTI. She has received zosyn 3.375 g IV today at 11:53 AM and vancomycin 1g IV at 12:26PM.  WBC was 16.3K , now 27.1K. Currently afebrile. Tm 103.2.  SCr 1.89 and CrCl ~ 31 ml/min    Goal of Therapy:  Vancomycin trough level 15-20 mcg/ml  Plan:  Zosyn 3.375 g IV q8h  (infuse each dose over 4 hours). Extra vancomycin 500mg  x1 today then dose will be vancomycin 1250 mg IV q24h.   Monitor renal function, clinical status and vancomycin steady state trough.   Nicole Cella, RPh Clinical Pharmacist Pager: [phone] 05/28/2013,4:31 PM

## 2013-05-28 NOTE — ED Notes (Signed)
Per ems, pt from home.  Family reports that the pt has been complaining of weakness, decreased appetitie, and fatigue.  Pt denies any cp.  Pt reports " i haven't eaten in 3 days".

## 2013-05-28 NOTE — Procedures (Signed)
Interventional Radiology Procedure Note  Procedure: Transhepatic percutaneous cholecystostomy tube placement, 53F tube.  100 mL purulent bile aspirated, sent for Cx Complications: None Recommendations: - Maintain to JP bulb suction until clinical status improves and output decreased, then transition to gravity bag drainage prior to DC - Return to IR in 4-6 weeks for tube check/change - Follow cultures  Signed,  Criselda Peaches, MD [facility]

## 2013-05-28 NOTE — Progress Notes (Signed)
eLink Physician-Brief Progress Note Patient Name: Drew Reed DOB: 1919/12/31 MRN: [id number]  Date of Service  05/28/2013   HPI/Events of Note     eICU Interventions  SCDs for DVT proph   Intervention Category Intermediate Interventions: Best-practice therapies (e.g. DVT, beta blocker, etc.)  [name] V. 05/28/2013, 5:38 PM

## 2013-05-28 NOTE — Consult Note (Signed)
Drew Reed 1919/12/13  [id number].    Requesting MD: Dr. Kennon Holter (resident) Chief Complaint/Reason for Consult: acute cholecystitis HPI:  79 y/o male was transferred from [facility] due to sepsis secondary to suspected acute cholecystitis.  He was also found to be in New onset AFIB and have a RBBB.  He arrived at [facility] ICU secondary to sepsis, the patients age and potential need for IR perc drainage.  The patient complained of 3-4 days of not feeling well, anorexia, nausea, diarrhea, and RUQ abdominal pain which worsened today which is why he presented to the [facility] today.  He currently has minimal pain in the RUQ, no more nausea and no vomiting.  He has been hypotensive and was given pressors for blood pressure support as he was unresponsive to fluids.  He denies any chest pain, SOB, fever/chills, bowel or bladder problems.  Last meal was Sunday.  We were consulted regarding possibility of surgical intervention.  The patient normally seeks care at the [place] and his a [facility] veteran.  He denies any abdominal surgeries, but has had a prostate and thyroid surgery.  He has 2 hernias (one supraumbilical containing fat, and one right inguinal with  non obstructed sigmoid colon).  ROS: All systems reviewed and otherwise negative except for as above  No family history on file.  Past Medical History  Diagnosis Date  . Hypertension   . Thyroid disease     History reviewed. No pertinent past surgical history.  Social History:  reports that he has never smoked. He does not have any smokeless tobacco history on file. He reports that he does not drink alcohol or use illicit drugs.  Allergies: No Known Allergies  Medications Prior to Admission  Medication Sig Dispense Refill  . finasteride (PROSCAR) 5 MG tablet Take 5 mg by mouth daily.      Marland Kitchen levothyroxine (SYNTHROID, LEVOTHROID) 25 MCG tablet Take 25 mcg by mouth daily before breakfast.      . lisinopril (PRINIVIL,ZESTRIL) 5 MG  tablet Take 2.5 mg by mouth daily.      . metoprolol tartrate (LOPRESSOR) 25 MG tablet Take 25 mg by mouth 2 (two) times daily. Hold if systolic pressure is less than 130.      . tamsulosin (FLOMAX) 0.4 MG CAPS capsule Take 0.4 mg by mouth daily.        Blood pressure 80/57, pulse 79, temperature 98.4 F (36.9 C), temperature source Oral, resp. rate 26, height 6\' 3"  (1.905 m), weight 197 lb 5 oz (89.5 kg), SpO2 94.00%. Physical Exam: General: pleasant, WD/WN white male who is laying in bed in NAD HEENT: head is normocephalic, atraumatic.  Sclera are noninjected.  PERRL.  Ears and nose without any masses or lesions.  Mouth is pink and moist Heart: regular, rate, and rhythm.  No obvious murmurs, gallops, or rubs noted.  Palpable pedal pulses bilaterally Lungs: CTAB, no wheezes, rhonchi, or rales noted.  Respiratory effort nonlabored Abd: soft, mild tenderness in the RUQ/epigastrium, +BS, supraumbilical hernia, right inguinal hernia both easily reducible and soft MS: all 4 extremities are symmetrical with no cyanosis, clubbing, or edema Skin: warm and dry with no masses, lesions, or rashes Psych: A&Ox3 with an appropriate affect.  Results for orders placed during the hospital encounter of 05/28/13 (from the past 48 hour(s))  CULTURE, BLOOD (ROUTINE X 2)     Status: None   Collection Time    05/28/13  9:57 AM      Result Value Range  Specimen Description Blood     Special Requests NONE     Culture NO GROWTH <24 HRS     Report Status PENDING    CBC WITH DIFFERENTIAL     Status: Abnormal   Collection Time    05/28/13  9:58 AM      Result Value Range   WBC 16.3 (*) 4.0 - 10.5 K/uL   RBC 4.29  4.22 - 5.81 MIL/uL   Hemoglobin 13.6  13.0 - 17.0 g/dL   HCT 39.3  39.0 - 52.0 %   MCV 91.6  78.0 - 100.0 fL   MCH 31.7  26.0 - 34.0 pg   MCHC 34.6  30.0 - 36.0 g/dL   RDW 13.9  11.5 - 15.5 %   Platelets 214  150 - 400 K/uL   Neutrophils Relative % 91 (*) 43 - 77 %   Neutro Abs 14.9 (*) 1.7 -  7.7 K/uL   Lymphocytes Relative 2 (*) 12 - 46 %   Lymphs Abs 0.4 (*) 0.7 - 4.0 K/uL   Monocytes Relative 7  3 - 12 %   Monocytes Absolute 1.1 (*) 0.1 - 1.0 K/uL   Eosinophils Relative 0  0 - 5 %   Eosinophils Absolute 0.0  0.0 - 0.7 K/uL   Basophils Relative 0  0 - 1 %   Basophils Absolute 0.0  0.0 - 0.1 K/uL  COMPREHENSIVE METABOLIC PANEL     Status: Abnormal   Collection Time    05/28/13  9:58 AM      Result Value Range   Sodium 138  135 - 145 mEq/L   Potassium 3.2 (*) 3.5 - 5.1 mEq/L   Chloride 104  96 - 112 mEq/L   CO2 25  19 - 32 mEq/L   Glucose, Bld 106 (*) 70 - 99 mg/dL   BUN 34 (*) 6 - 23 mg/dL   Creatinine, Ser 1.89 (*) 0.50 - 1.35 mg/dL   Calcium 8.8  8.4 - 10.5 mg/dL   Total Protein 5.9 (*) 6.0 - 8.3 g/dL   Albumin 2.9 (*) 3.5 - 5.2 g/dL   AST 65 (*) 0 - 37 U/L   ALT 36  0 - 53 U/L   Alkaline Phosphatase 84  39 - 117 U/L   Total Bilirubin 2.6 (*) 0.3 - 1.2 mg/dL   GFR calc non Af Amer 30 (*) >90 mL/min   GFR calc Af Amer 34 (*) >90 mL/min   Comment: (NOTE)     The eGFR has been calculated using the CKD EPI equation.     This calculation has not been validated in all clinical situations.     eGFR's persistently <90 mL/min signify possible Chronic Kidney     Disease.  PROCALCITONIN     Status: None   Collection Time    05/28/13  9:58 AM      Result Value Range   Procalcitonin 68.49     Comment:            Interpretation:     PCT >= 10 ng/mL:     Important systemic inflammatory response,     almost exclusively due to severe bacterial     sepsis or septic shock.     (NOTE)             ICU PCT Algorithm               Non ICU PCT Algorithm        ----------------------------     ------------------------------  PCT < 0.25 ng/mL                 PCT < 0.1 ng/mL         Stopping of antibiotics            Stopping of antibiotics           strongly encouraged.               strongly encouraged.        ----------------------------      ------------------------------           PCT level decrease by               PCT < 0.25 ng/mL           >= 80% from peak PCT           OR PCT 0.25 - 0.5 ng/mL          Stopping of antibiotics                                                 encouraged.         Stopping of antibiotics               encouraged.        ----------------------------     ------------------------------           PCT level decrease by              PCT >= 0.25 ng/mL           < 80% from peak PCT            AND PCT >= 0.5 ng/mL            Continuing antibiotics                                                  encouraged.           Continuing antibiotics                encouraged.        ----------------------------     ------------------------------         PCT level increase compared          PCT > 0.5 ng/mL             with peak PCT AND              PCT >= 0.5 ng/mL             Escalation of antibiotics                                              strongly encouraged.          Escalation of antibiotics            strongly encouraged.  TROPONIN I     Status: None   Collection Time    05/28/13  9:58 AM      Result Value Range   Troponin I <0.30  <0.30  ng/mL   Comment:            Due to the release kinetics of cTnI,     a negative result within the first hours     of the onset of symptoms does not rule out     myocardial infarction with certainty.     If myocardial infarction is still suspected,     repeat the test at appropriate intervals.  LACTIC ACID, PLASMA     Status: Abnormal   Collection Time    05/28/13  9:58 AM      Result Value Range   Lactic Acid, Venous 2.4 (*) 0.5 - 2.2 mmol/L  CULTURE, BLOOD (ROUTINE X 2)     Status: None   Collection Time    05/28/13 10:04 AM      Result Value Range   Specimen Description Blood     Special Requests NONE     Culture NO GROWTH <24 HRS     Report Status PENDING    URINALYSIS, ROUTINE W REFLEX MICROSCOPIC     Status: Abnormal   Collection Time     05/28/13 11:25 AM      Result Value Range   Color, Urine YELLOW  YELLOW   APPearance CLEAR  CLEAR   Specific Gravity, Urine >1.030 (*) 1.005 - 1.030   pH 5.5  5.0 - 8.0   Glucose, UA NEGATIVE  NEGATIVE mg/dL   Hgb urine dipstick MODERATE (*) NEGATIVE   Bilirubin Urine NEGATIVE  NEGATIVE   Ketones, ur NEGATIVE  NEGATIVE mg/dL   Protein, ur TRACE (*) NEGATIVE mg/dL   Urobilinogen, UA 0.2  0.0 - 1.0 mg/dL   Nitrite NEGATIVE  NEGATIVE   Leukocytes, UA TRACE (*) NEGATIVE  URINE MICROSCOPIC-ADD ON     Status: Abnormal   Collection Time    05/28/13 11:25 AM      Result Value Range   WBC, UA 3-6  <3 WBC/hpf   RBC / HPF 7-10  <3 RBC/hpf   Bacteria, UA MANY (*) RARE   Ct Abdomen Pelvis Wo Contrast  05/28/2013   CLINICAL DATA:  Septate, weakness, fatigue, history hypertension  EXAM: CT ABDOMEN AND PELVIS WITHOUT CONTRAST  TECHNIQUE: Multidetector CT imaging of the abdomen and pelvis was performed following the standard protocol without intravenous contrast. Sagittal and coronal MPR images reconstructed from axial data set. Oral contrast not administered.  COMPARISON:  None  FINDINGS: Minimal bibasilar atelectasis.  Two right lung base nodules, 7 mm diameter image 10 and 11 mm diameter image 11.  Distended gallbladder with thickened wall and pericholecystic edema highly suspicious for acute cholecystitis.  No definite biliary tract calcifications identified.  Calcified granulomata within spleen.  Tiny calcified granuloma at upper pole right kidney image 23.  Within limits of a nonenhanced exam, no additional focal abnormalities of the liver, spleen, pancreas, kidneys, or adrenal glands otherwise identified.  Supraumbilical ventral hernia containing fat.  Right inguinal hernia containing nonobstructed segment of sigmoid colon.  Scattered mild colonic diverticulosis distally.  Atherosclerotic calcifications including coronary arteries.  Stomach and bowel loops otherwise normal appearance.  Bladder  decompressed by Foley catheter with enlarged prostate gland noted.  No mass, adenopathy, free fluid, or free air.  Bones demineralized.  IMPRESSION: Distended gallbladder with thickened gallbladder wall and pericholecystic edema highly suspicious for acute cholecystitis.  Right inguinal hernia containing a nonobstructed segment of sigmoid colon.  Supraumbilical ventral hernia containing fat.  Nonobstructing right renal calculus.  Two nonspecific nodules are seen at  the right lung base, 7 mm and 11 mm in size, the metastatic disease not excluded with this appearance; recommend followup CT imaging of the chest when the patient's condition permits.   Electronically Signed   By: Lavonia Dana M.D.   On: 05/28/2013 12:43   Dg Chest Port 1 View  05/28/2013   CLINICAL DATA:  Fatigue, weakness  EXAM: PORTABLE CHEST - 1 VIEW  COMPARISON:  None.  FINDINGS: Left lung base is poorly visualized. A left basilar opacity is not excluded.  No frank interstitial edema. No pneumothorax.  Cardiomegaly.  IMPRESSION: Left lung base is poorly visualized. A left basilar opacity is not excluded. Consider PA/ lateral chest radiographs further evaluation.   Electronically Signed   By: Julian Hy M.D.   On: 05/28/2013 11:20   Dg Chest Port 1v Same Day  05/28/2013   CLINICAL DATA:  Central line placement  EXAM: PORTABLE CHEST - 1 VIEW SAME DAY  COMPARISON:  Portable exam 1252 hr compared to 1107 hr.  FINDINGS: New left subclavian central venous catheter with tip projecting over SVC.  Enlargement of cardiac silhouette.  Atherosclerotic calcification aorta.  Chronic accentuation of interstitial markings in both lungs.  Cannot exclude bibasilar infiltrates.  No gross pleural effusion or pneumothorax.  IMPRESSION: Enlargement of cardiac silhouette.  No pneumothorax following central line placement.  Bibasilar opacities cannot exclude infiltrate.   Electronically Signed   By: Lavonia Dana M.D.   On: 05/28/2013 13:20        Assessment/Plan CT findings consistent with acute cholecystitis RUQ abdominal pain Nausea Leukocytosis - 16.3 Mild elevation in LFT's Hyperbilirubinemia - 2.6  Plan: 1.  Can take two approaches.  Percutaneous cholecystostomy tube, antibiotics, and discharge to follow up as an outpatient in approximately 6 weeks to determine if perc chole tube can be discontinued with or without surgical intervention.  Or we could perform a lap chole with the understanding of high surgical risks due to the patients age, medical comorbidities.  I have discussed these with the patient and his family and he wishes to proceed with the IR perc chole tube.  This is to be completed today. 2.  NPO,  IVF, pain control, antiemetics 3.  Will follow tomorrow to see if the patient improves after his perc drain   DORT, Nahiara Kretzschmar 05/28/2013, 3:43 PM Pager: [phone]

## 2013-05-29 ENCOUNTER — Inpatient Hospital Stay (HOSPITAL_COMMUNITY): Payer: Medicare Other

## 2013-05-29 DIAGNOSIS — K819 Cholecystitis, unspecified: Secondary | ICD-10-CM

## 2013-05-29 DIAGNOSIS — I369 Nonrheumatic tricuspid valve disorder, unspecified: Secondary | ICD-10-CM

## 2013-05-29 LAB — TROPONIN I: Troponin I: <0.3 ng/mL (ref <0.30)

## 2013-05-29 LAB — CBC
HCT: 35.9 % — ABNORMAL LOW (ref 39.0–52.0)
Hemoglobin: 12.5 g/dL — ABNORMAL LOW (ref 13.0–17.0)
MCH: 31.3 pg (ref 26.0–34.0)
MCHC: 34.8 g/dL (ref 30.0–36.0)
Platelets: 232 10*3/uL (ref 150–400)
RBC: 4 MIL/uL — ABNORMAL LOW (ref 4.22–5.81)
WBC: 16.9 10*3/uL — ABNORMAL HIGH (ref 4.0–10.5)

## 2013-05-29 LAB — BASIC METABOLIC PANEL
BUN: 32 mg/dL — ABNORMAL HIGH (ref 6–23)
CO2: 18 mEq/L — ABNORMAL LOW (ref 19–32)
Calcium: 7.8 mg/dL — ABNORMAL LOW (ref 8.4–10.5)
Chloride: 111 mEq/L (ref 96–112)
GFR calc Af Amer: 64 mL/min — ABNORMAL LOW (ref >90)
GFR calc non Af Amer: 55 mL/min — ABNORMAL LOW (ref >90)
Glucose, Bld: 124 mg/dL — ABNORMAL HIGH (ref 70–99)
Potassium: 3.2 mEq/L — ABNORMAL LOW (ref 3.5–5.1)
Sodium: 141 mEq/L (ref 135–145)

## 2013-05-29 LAB — PHOSPHORUS: Phosphorus: 3.5 mg/dL (ref 2.3–4.6)

## 2013-05-29 LAB — MAGNESIUM: Magnesium: 1.5 mg/dL (ref 1.5–2.5)

## 2013-05-29 LAB — CORTISOL: Cortisol, Plasma: 32.9 ug/dL

## 2013-05-29 MED ORDER — PIPERACILLIN-TAZOBACTAM 3.375 G IVPB
3.3750 g | Freq: Three times a day (TID) | INTRAVENOUS | Status: DC
  Administered 2013-05-29 – 2013-06-01 (×9): 3.375 g via INTRAVENOUS
  Filled 2013-05-29 (×12): qty 50

## 2013-05-29 MED ORDER — MAGNESIUM SULFATE 40 MG/ML IJ SOLN
4.0000 g | Freq: Once | INTRAMUSCULAR | Status: AC
  Administered 2013-05-29: 4 g via INTRAVENOUS
  Filled 2013-05-29: qty 100

## 2013-05-29 MED ORDER — POTASSIUM CHLORIDE CRYS ER 20 MEQ PO TBCR
30.0000 meq | EXTENDED_RELEASE_TABLET | ORAL | Status: AC
  Administered 2013-05-29 (×2): 30 meq via ORAL
  Filled 2013-05-29 (×4): qty 1

## 2013-05-29 MED ORDER — INFLUENZA VAC SPLIT QUAD 0.5 ML IM SUSP
0.5000 mL | INTRAMUSCULAR | Status: AC
  Administered 2013-05-30: 0.5 mL via INTRAMUSCULAR
  Filled 2013-05-29: qty 0.5

## 2013-05-29 MED ORDER — FAMOTIDINE IN NACL 20-0.9 MG/50ML-% IV SOLN
20.0000 mg | Freq: Two times a day (BID) | INTRAVENOUS | Status: DC
  Administered 2013-05-29 (×2): 20 mg via INTRAVENOUS
  Filled 2013-05-29 (×4): qty 50

## 2013-05-29 MED ORDER — SODIUM CHLORIDE 0.45 % IV SOLN
INTRAVENOUS | Status: DC
  Administered 2013-05-29: 12:00:00 via INTRAVENOUS
  Filled 2013-05-29 (×3): qty 1000

## 2013-05-29 MED ORDER — SODIUM CHLORIDE 0.9 % IV BOLUS (SEPSIS)
500.0000 mL | Freq: Once | INTRAVENOUS | Status: AC
  Administered 2013-05-29: 500 mL via INTRAVENOUS

## 2013-05-29 NOTE — Progress Notes (Signed)
[facility] ICU Electrolyte Replacement Protocol  Patient Name: Drew Reed DOB: May 06, 1920 MRN: [id number]  Date of Service  05/29/2013   HPI/Events of Note    Recent Labs Lab 05/28/13 0958 05/28/13 1535 05/29/13 0500  NA 138 137 141  K 3.2* 3.3* 3.2*  CL 104 106 111  CO2 25 16* 18*  GLUCOSE 106* 121* 124*  BUN 34* 33* 32*  CREATININE 1.89* 1.46* 1.13  CALCIUM 8.8 7.7* 7.8*  MG  --  1.4* 1.5  PHOS  --  2.8 3.5    Estimated Creatinine Clearance: 51.9 ml/min (by C-G formula based on Cr of 1.13).  Intake/Output     10/01 0701 - 10/02 0700   I.V. (mL/kg) 2600.7 (29.1)   IV Piggyback 150   Total Intake(mL/kg) 2750.7 (30.7)   Urine (mL/kg/hr) 480   Total Output 480   Net +2270.7        - I/O DETAILED x24h    Total I/O In: 1429.2 [I.V.:1379.2; IV Piggyback:50] Out: 430 [Urine:430] - I/O THIS SHIFT    ASSESSMENT   eICURN Interventions  K+ 3.2 Electrolyte protocol criteria met. Lab values replaced per protocol. MD notified.    ASSESSMENT: MAJOR ELECTROLYTE    Lorene Dy 05/29/2013, 6:35 AM

## 2013-05-29 NOTE — Progress Notes (Signed)
Agree with PA note.  No definite stones seen thus far on imaging.  If this proves to be acalculous cholecystitis and the cystic duct regains patency in 6-8 weeks, we may be able to remove the tube safely.   Signed,  Criselda Peaches, MD [facility]

## 2013-05-29 NOTE — Progress Notes (Signed)
PULMONARY  / CRITICAL CARE MEDICINE  Name: Drew Reed MRN: [id number] DOB: 10-04-1919    ADMISSION DATE:  05/28/2013 CONSULTATION DATE:  05/28/2013  REFERRING MD :  Jola Schmidt PRIMARY SERVICE: Critical care  CHIEF COMPLAINT:  Weakness  BRIEF PATIENT DESCRIPTION: Drew Reed is a 79 y/o male transferred from [facility] for sepsis likely due to acute cholecystitis and UTI.  SIGNIFICANT EVENTS / STUDIES:  10/1- transferred to [facility] MICU 10/1 EKG with RBB and new-onset afib 10/1 Started on levophed 10/1 Percutaneous cholecystostomy placed by IR  LINES / TUBES: L IJ 10/1>>> Foley catheter 10/1>>> Percutaneous cholecystostomy tube 10/1>>>  CULTURES: Blood Cx X 2 10/1>>> Urine Culture 10/1>>>  ANTIBIOTICS: Vancomycin 10/1>>> Zosyn 10/1>>>  SUBJECTIVE: No acute events overnight. Continued abdominal pain. Some right sided pleuritic chest pain.  Remains on pressors  VITAL SIGNS: Temp:  [97.5 F (36.4 C)-103.2 F (39.6 C)] 97.9 F (36.6 C) (10/02 0349) Pulse Rate:  [59-109] 71 (10/02 0500) Resp:  [12-32] 18 (10/02 0500) BP: (72-127)/(42-80) 97/52 mmHg (10/02 0500) SpO2:  [92 %-100 %] 95 % (10/02 0500) Weight:  [197 lb 5 oz (89.5 kg)] 197 lb 5 oz (89.5 kg) (10/01 1450) HEMODYNAMICS:  CVP:  [6 mmHg-9 mmHg] 7 mmHg VENTILATOR SETTINGS: N/A   INTAKE / OUTPUT: Intake/Output     10/01 0701 - 10/02 0700   I.V. (mL/kg) 2600.7 (29.1)   IV Piggyback 150   Total Intake(mL/kg) 2750.7 (30.7)   Urine (mL/kg/hr) 480   Total Output 480   Net +2270.7         PHYSICAL EXAMINATION: General:  NAD, alert and interactive Neuro:  Strength 5/5 and sensation intact in all 4 extremities, CN 2-12 intact HEENT:  MMM Cardiovascular:  Irregularly irregular rhythm, no murmur, distant heart sounds as he is barrel chested Lungs:  CTAB Abdomen:  Soft, tender to palpation of RUQ and epigastrium.  Musculoskeletal:  No edema, cool feet BL Skin:  Intact without evident  lesions  LABS:  CBC Recent Labs     05/28/13  0958  05/28/13  1535  05/29/13  0500  WBC  16.3*  27.1*  16.9*  HGB  13.6  12.5*  12.5*  HCT  39.3  35.8*  35.9*  PLT  214  222  232   Coag's Recent Labs     05/28/13  1535  APTT  31  INR  1.51*   BMET Recent Labs     05/28/13  0958  05/28/13  1535  05/29/13  0500  NA  138  137  141  K  3.2*  3.3*  3.2*  CL  104  106  111  CO2  25  16*  18*  BUN  34*  33*  32*  CREATININE  1.89*  1.46*  1.13  GLUCOSE  106*  121*  124*   Electrolytes Recent Labs     05/28/13  0958  05/28/13  1535  05/29/13  0500  CALCIUM  8.8  7.7*  7.8*  MG   --   1.4*  1.5  PHOS   --   2.8  3.5   Sepsis Markers Recent Labs     05/28/13  0958  PROCALCITON  68.49   ABG No results found for this basename: PHART, PCO2ART, PO2ART,  in the last 72 hours Liver Enzymes Recent Labs     05/28/13  0958  05/28/13  1535  AST  65*  63*  ALT  36  42  ALKPHOS  84  78  BILITOT  2.6*  2.1*  ALBUMIN  2.9*  2.7*   Cardiac Enzymes Recent Labs     05/28/13  0958  05/28/13  1552  05/28/13  2105  05/29/13  0305  TROPONINI  <0.30  <0.30  <0.30  <0.30  PROBNP   --   5066.0*   --    --    Glucose Recent Labs     05/28/13  1542  05/28/13  1918  GLUCAP  115*  119*    Imaging Ct Abdomen Pelvis Wo Contrast  05/28/2013   CLINICAL DATA:  Septate, weakness, fatigue, history hypertension  EXAM: CT ABDOMEN AND PELVIS WITHOUT CONTRAST  TECHNIQUE: Multidetector CT imaging of the abdomen and pelvis was performed following the standard protocol without intravenous contrast. Sagittal and coronal MPR images reconstructed from axial data set. Oral contrast not administered.  COMPARISON:  None  FINDINGS: Minimal bibasilar atelectasis.  Two right lung base nodules, 7 mm diameter image 10 and 11 mm diameter image 11.  Distended gallbladder with thickened wall and pericholecystic edema highly suspicious for acute cholecystitis.  No definite biliary tract  calcifications identified.  Calcified granulomata within spleen.  Tiny calcified granuloma at upper pole right kidney image 23.  Within limits of a nonenhanced exam, no additional focal abnormalities of the liver, spleen, pancreas, kidneys, or adrenal glands otherwise identified.  Supraumbilical ventral hernia containing fat.  Right inguinal hernia containing nonobstructed segment of sigmoid colon.  Scattered mild colonic diverticulosis distally.  Atherosclerotic calcifications including coronary arteries.  Stomach and bowel loops otherwise normal appearance.  Bladder decompressed by Foley catheter with enlarged prostate gland noted.  No mass, adenopathy, free fluid, or free air.  Bones demineralized.  IMPRESSION: Distended gallbladder with thickened gallbladder wall and pericholecystic edema highly suspicious for acute cholecystitis.  Right inguinal hernia containing a nonobstructed segment of sigmoid colon.  Supraumbilical ventral hernia containing fat.  Nonobstructing right renal calculus.  Two nonspecific nodules are seen at the right lung base, 7 mm and 11 mm in size, the metastatic disease not excluded with this appearance; recommend followup CT imaging of the chest when the patient's condition permits.   Electronically Signed   By: Lavonia Dana M.D.   On: 05/28/2013 12:43   Dg Chest Port 1 View  05/28/2013   CLINICAL DATA:  Fatigue, weakness  EXAM: PORTABLE CHEST - 1 VIEW  COMPARISON:  None.  FINDINGS: Left lung base is poorly visualized. A left basilar opacity is not excluded.  No frank interstitial edema. No pneumothorax.  Cardiomegaly.  IMPRESSION: Left lung base is poorly visualized. A left basilar opacity is not excluded. Consider PA/ lateral chest radiographs further evaluation.   Electronically Signed   By: Julian Hy M.D.   On: 05/28/2013 11:20   Dg Chest Port 1v Same Day  05/28/2013   CLINICAL DATA:  Central line placement  EXAM: PORTABLE CHEST - 1 VIEW SAME DAY  COMPARISON:  Portable  exam 1252 hr compared to 1107 hr.  FINDINGS: New left subclavian central venous catheter with tip projecting over SVC.  Enlargement of cardiac silhouette.  Atherosclerotic calcification aorta.  Chronic accentuation of interstitial markings in both lungs.  Cannot exclude bibasilar infiltrates.  No gross pleural effusion or pneumothorax.  IMPRESSION: Enlargement of cardiac silhouette.  No pneumothorax following central line placement.  Bibasilar opacities cannot exclude infiltrate.   Electronically Signed   By: Lavonia Dana M.D.   On: 05/28/2013 13:20   ASSESSMENT / PLAN:  PULMONARY A: With barrel chest consider COPD- emphysema, but no former diagnosis P:   - O2 via [place] to maintain sats above 88% -low threshold BDer's  CARDIOVASCULAR A:  New onset Afib, rate controlled Hypotension 2/2 septic shock, SISR post drain likely ACS ruled out No Rel AI P:  - Levophed drip, attempted wean overnight with continued hypotension, continue - monitor CVPs - will need anticoagulation but will defer for 24 hours post procedure and risks fall? - Hold home BB and ACEi - will dc fluids this am as BNP os 5k -tsh, consider echo -cvp 8-10, bolus further in hopes to dc levo  RENAL A:   Hx of renal failure needing HD after obstructing stone Likely pre-renal AKI on CKD, but baseline GFR unknown, Cre improved to  Volume status improved Non ag hyperchloremic acidosis P:   - NS at 125 mL/hr, change to 1/2 NS - trend creatinine - avoid nephrotoxins -replace k, mag  GASTROINTESTINAL A:   Acute cholecystitis s/p perc drainage placement  P:   - General surgery and IR consulted - advance diet to fulls if able -add pepcid  HEMATOLOGIC A:  dvt prevention, fib new P:  - Needs anticoagulation as above, will defer - Trend cbc/plts -scd  INFECTIOUS A:   Acute cholecystitis Likely UTI P:   - Vanc and zosyn per pharm - Blood and urine cultures pending -ensure drain culture sent  ENDOCRINE A:   No  known problems P:   - Cortisol 32, no need for additional steroids  NEUROLOGIC A:   No known problems P:   - monitor mental status with sepsis  TODAY'S SUMMARY: Will continue broad spectrum antibiotic, await cultures, and wean pressors as tolerated. Bolus, cvp low, bolus Laroy Apple, MD [place] Resident, PGY-2 05/29/2013, 6:20 AM  Ccm time 30 min  I have fully examined this patient and agree with above findings.    And edited in full  Lavon Paganini. Titus Mould, MD, [place] Pgr: [place] [facility]

## 2013-05-29 NOTE — Progress Notes (Signed)
*  PRELIMINARY RESULTS* Echocardiogram 2D Echocardiogram has been performed.  Leavy Cella 05/29/2013, 4:48 PM

## 2013-05-29 NOTE — Progress Notes (Signed)
Subjective: Percutaneous chole drain placed 10/1 Pt feels better today resting  Objective: Vital signs in last 24 hours: Temp:  [97.4 F (36.3 C)-103.2 F (39.6 C)] 97.4 F (36.3 C) (10/02 0836) Pulse Rate:  [58-103] 75 (10/02 0900) Resp:  [16-32] 16 (10/02 0900) BP: (72-127)/(42-80) 101/66 mmHg (10/02 0900) SpO2:  [92 %-100 %] 93 % (10/02 0900) Weight:  [197 lb 5 oz (89.5 kg)] 197 lb 5 oz (89.5 kg) (10/01 1450) Last BM Date: 05/27/13  Intake/Output from previous day: 10/01 0701 - 10/02 0700 In: 2948.2 [I.V.:2748.2; IV Piggyback:200] Out: 655 [Urine:605; Drains:50] Intake/Output this shift: Total I/O In: 30 [I.V.:30] Out: 185 [Urine:185]  PE: afeb; vss Chole drain intact Site clean and dry; NT No bleeding Output bilious: 50 cc yesterday; 50 cc in JP Cx: gram- rods Wbc 16.9 (27.1)  Lab Results:   Recent Labs  05/28/13 1535 05/29/13 0500  WBC 27.1* 16.9*  HGB 12.5* 12.5*  HCT 35.8* 35.9*  PLT 222 232   BMET  Recent Labs  05/28/13 1535 05/29/13 0500  NA 137 141  K 3.3* 3.2*  CL 106 111  CO2 16* 18*  GLUCOSE 121* 124*  BUN 33* 32*  CREATININE 1.46* 1.13  CALCIUM 7.7* 7.8*   PT/INR  Recent Labs  05/28/13 1535  LABPROT 17.8*  INR 1.51*   ABG No results found for this basename: PHART, PCO2, PO2, HCO3,  in the last 72 hours  Studies/Results: Ct Abdomen Pelvis Wo Contrast  05/28/2013   CLINICAL DATA:  Septate, weakness, fatigue, history hypertension  EXAM: CT ABDOMEN AND PELVIS WITHOUT CONTRAST  TECHNIQUE: Multidetector CT imaging of the abdomen and pelvis was performed following the standard protocol without intravenous contrast. Sagittal and coronal MPR images reconstructed from axial data set. Oral contrast not administered.  COMPARISON:  None  FINDINGS: Minimal bibasilar atelectasis.  Two right lung base nodules, 7 mm diameter image 10 and 11 mm diameter image 11.  Distended gallbladder with thickened wall and pericholecystic edema highly  suspicious for acute cholecystitis.  No definite biliary tract calcifications identified.  Calcified granulomata within spleen.  Tiny calcified granuloma at upper pole right kidney image 23.  Within limits of a nonenhanced exam, no additional focal abnormalities of the liver, spleen, pancreas, kidneys, or adrenal glands otherwise identified.  Supraumbilical ventral hernia containing fat.  Right inguinal hernia containing nonobstructed segment of sigmoid colon.  Scattered mild colonic diverticulosis distally.  Atherosclerotic calcifications including coronary arteries.  Stomach and bowel loops otherwise normal appearance.  Bladder decompressed by Foley catheter with enlarged prostate gland noted.  No mass, adenopathy, free fluid, or free air.  Bones demineralized.  IMPRESSION: Distended gallbladder with thickened gallbladder wall and pericholecystic edema highly suspicious for acute cholecystitis.  Right inguinal hernia containing a nonobstructed segment of sigmoid colon.  Supraumbilical ventral hernia containing fat.  Nonobstructing right renal calculus.  Two nonspecific nodules are seen at the right lung base, 7 mm and 11 mm in size, the metastatic disease not excluded with this appearance; recommend followup CT imaging of the chest when the patient's condition permits.   Electronically Signed   By: Lavonia Dana M.D.   On: 05/28/2013 12:43   Dg Chest Port 1 View  05/29/2013   CLINICAL DATA:  The respiratory failure.  EXAM: PORTABLE CHEST - 1 VIEW  COMPARISON:  05/28/2013  FINDINGS: Insert COPD cardiomegaly with vascular congestion. Improving aeration in the lung bases. Left central line is unchanged.  IMPRESSION: Cardiomegaly, COPD. Improving bibasilar opacities.   Electronically  Signed   By: Rolm Baptise M.D.   On: 05/29/2013 06:23   Dg Chest Port 1 View  05/28/2013   CLINICAL DATA:  Fatigue, weakness  EXAM: PORTABLE CHEST - 1 VIEW  COMPARISON:  None.  FINDINGS: Left lung base is poorly visualized. A left  basilar opacity is not excluded.  No frank interstitial edema. No pneumothorax.  Cardiomegaly.  IMPRESSION: Left lung base is poorly visualized. A left basilar opacity is not excluded. Consider PA/ lateral chest radiographs further evaluation.   Electronically Signed   By: Julian Hy M.D.   On: 05/28/2013 11:20   Dg Chest Port 1v Same Day  05/28/2013   CLINICAL DATA:  Central line placement  EXAM: PORTABLE CHEST - 1 VIEW SAME DAY  COMPARISON:  Portable exam 1252 hr compared to 1107 hr.  FINDINGS: New left subclavian central venous catheter with tip projecting over SVC.  Enlargement of cardiac silhouette.  Atherosclerotic calcification aorta.  Chronic accentuation of interstitial markings in both lungs.  Cannot exclude bibasilar infiltrates.  No gross pleural effusion or pneumothorax.  IMPRESSION: Enlargement of cardiac silhouette.  No pneumothorax following central line placement.  Bibasilar opacities cannot exclude infiltrate.   Electronically Signed   By: Lavonia Dana M.D.   On: 05/28/2013 13:20    Anti-infectives: Anti-infectives   Start     Dose/Rate Route Frequency Ordered Stop   05/29/13 1230  vancomycin (VANCOCIN) 1,250 mg in sodium chloride 0.9 % 250 mL IVPB     1,250 mg 166.7 mL/hr over 90 Minutes Intravenous Every 24 hours 05/28/13 1656     05/28/13 2000  piperacillin-tazobactam (ZOSYN) IVPB 3.375 g     3.375 g 12.5 mL/hr over 240 Minutes Intravenous 3 times per day 05/28/13 1656     05/28/13 1730  vancomycin (VANCOCIN) 500 mg in sodium chloride 0.9 % 100 mL IVPB     500 mg 100 mL/hr over 60 Minutes Intravenous NOW 05/28/13 1656 05/28/13 1830   05/28/13 1515  piperacillin-tazobactam (ZOSYN) IVPB 3.375 g     3.375 g 100 mL/hr over 30 Minutes Intravenous  Once 05/28/13 1507 05/28/13 1623   05/28/13 1515  vancomycin (VANCOCIN) IVPB 1000 mg/200 mL premix  Status:  Discontinued     1,000 mg 200 mL/hr over 60 Minutes Intravenous  Once 05/28/13 1507 05/28/13 1516   05/28/13 1100   piperacillin-tazobactam (ZOSYN) IVPB 3.375 g     3.375 g 100 mL/hr over 30 Minutes Intravenous  Once 05/28/13 1050 05/28/13 1227   05/28/13 1100  vancomycin (VANCOCIN) IVPB 1000 mg/200 mL premix     1,000 mg 200 mL/hr over 60 Minutes Intravenous  Once 05/28/13 1050 05/28/13 1335      Assessment/Plan: s/p * No surgery found *  Chole drain intact Better Will follow few days Will need to stay in x 6-8 weeks Plan per [facility]   LOS: 1 day    Kimiyah Blick A 05/29/2013

## 2013-05-29 NOTE — Progress Notes (Signed)
Imogene Burn. Georgette Dover, MD, [facility] Surgery  General/ Trauma Surgery  05/29/2013 8:21 AM

## 2013-05-29 NOTE — Care Management Note (Signed)
    Page 1 of 2   06/02/2013     2:40:24 PM   CARE MANAGEMENT NOTE 06/02/2013  Patient:  Drew Reed, Drew Reed   Account Number:  [id number]  Date Initiated:  05/29/2013  Documentation initiated by:  [facility]  Subjective/Objective Assessment:   Septic - GB drain placed.     Action/Plan:   Anticipated DC Date:  06/03/2013   Anticipated DC Plan:  [place]  CM consult      [facility] Choice  HOME HEALTH   Choice offered to / List presented to:  C-1 Patient        [place] arranged  HH-1 RN  [place] [facility]      [place].   Status of service:  Completed, signed off Medicare Important Message given?   (If response is "NO", the following Medicare IM given date fields will be blank) Date Medicare IM given:   Date Additional Medicare IM given:    Discharge Disposition:  [place]  Per UR Regulation:  Reviewed for med. necessity/level of care/duration of stay  If discussed at [place] of Stay Meetings, dates discussed:    Comments:  Contact:  [name], [name] Spouse [id number]   [phone]                 [name],[name] Daughter [phone]   06-02-13 [address], [place] [phone] CM did speak to pt and called wife. Left Vm for her to return call. Pt will need HHRN and PT services. CM will f/u and make referral with agency of choice.  05-29-13 2:35pm Luz Lex, [place] [phone] Notified he was a [facility] patient by nurse.  Called wife, verified he is a patient at [facility] notified of admission.

## 2013-05-29 NOTE — Progress Notes (Signed)
Subjective: Percutaneous cholecystostomy placed yesterday by IR.  Patient feels better already Weaned off pressor Hungry/ thirsty  Objective: Vital signs in last 24 hours: Temp:  [97.5 F (36.4 C)-103.2 F (39.6 C)] 97.9 F (36.6 C) (10/02 0349) Pulse Rate:  [58-109] 68 (10/02 0800) Resp:  [12-32] 24 (10/02 0800) BP: (72-127)/(42-80) 108/61 mmHg (10/02 0800) SpO2:  [92 %-100 %] 96 % (10/02 0800) Weight:  [197 lb 5 oz (89.5 kg)] 197 lb 5 oz (89.5 kg) (10/01 1450) Last BM Date: 05/27/13  Intake/Output from previous day: 10/01 0701 - 10/02 0700 In: 2948.2 [I.V.:2748.2; IV Piggyback:200] Out: 605 [Urine:605] Intake/Output this shift: Total I/O In: 15 [I.V.:15] Out: -   General appearance: alert, cooperative and no distress GI: soft, mild tenderness in RUQ; no peritoneal signs Drain with clear bile  Lab Results:   Recent Labs  05/28/13 1535 05/29/13 0500  WBC 27.1* 16.9*  HGB 12.5* 12.5*  HCT 35.8* 35.9*  PLT 222 232   BMET  Recent Labs  05/28/13 1535 05/29/13 0500  NA 137 141  K 3.3* 3.2*  CL 106 111  CO2 16* 18*  GLUCOSE 121* 124*  BUN 33* 32*  CREATININE 1.46* 1.13  CALCIUM 7.7* 7.8*   PT/INR  Recent Labs  05/28/13 1535  LABPROT 17.8*  INR 1.51*   ABG No results found for this basename: PHART, PCO2, PO2, HCO3,  in the last 72 hours  Studies/Results: Ct Abdomen Pelvis Wo Contrast  05/28/2013   CLINICAL DATA:  Septate, weakness, fatigue, history hypertension  EXAM: CT ABDOMEN AND PELVIS WITHOUT CONTRAST  TECHNIQUE: Multidetector CT imaging of the abdomen and pelvis was performed following the standard protocol without intravenous contrast. Sagittal and coronal MPR images reconstructed from axial data set. Oral contrast not administered.  COMPARISON:  None  FINDINGS: Minimal bibasilar atelectasis.  Two right lung base nodules, 7 mm diameter image 10 and 11 mm diameter image 11.  Distended gallbladder with thickened wall and pericholecystic edema  highly suspicious for acute cholecystitis.  No definite biliary tract calcifications identified.  Calcified granulomata within spleen.  Tiny calcified granuloma at upper pole right kidney image 23.  Within limits of a nonenhanced exam, no additional focal abnormalities of the liver, spleen, pancreas, kidneys, or adrenal glands otherwise identified.  Supraumbilical ventral hernia containing fat.  Right inguinal hernia containing nonobstructed segment of sigmoid colon.  Scattered mild colonic diverticulosis distally.  Atherosclerotic calcifications including coronary arteries.  Stomach and bowel loops otherwise normal appearance.  Bladder decompressed by Foley catheter with enlarged prostate gland noted.  No mass, adenopathy, free fluid, or free air.  Bones demineralized.  IMPRESSION: Distended gallbladder with thickened gallbladder wall and pericholecystic edema highly suspicious for acute cholecystitis.  Right inguinal hernia containing a nonobstructed segment of sigmoid colon.  Supraumbilical ventral hernia containing fat.  Nonobstructing right renal calculus.  Two nonspecific nodules are seen at the right lung base, 7 mm and 11 mm in size, the metastatic disease not excluded with this appearance; recommend followup CT imaging of the chest when the patient's condition permits.   Electronically Signed   By: Lavonia Dana M.D.   On: 05/28/2013 12:43   Dg Chest Port 1 View  05/29/2013   CLINICAL DATA:  The respiratory failure.  EXAM: PORTABLE CHEST - 1 VIEW  COMPARISON:  05/28/2013  FINDINGS: Insert COPD cardiomegaly with vascular congestion. Improving aeration in the lung bases. Left central line is unchanged.  IMPRESSION: Cardiomegaly, COPD. Improving bibasilar opacities.   Electronically Signed  By: Rolm Baptise M.D.   On: 05/29/2013 06:23   Dg Chest Port 1 View  05/28/2013   CLINICAL DATA:  Fatigue, weakness  EXAM: PORTABLE CHEST - 1 VIEW  COMPARISON:  None.  FINDINGS: Left lung base is poorly visualized. A  left basilar opacity is not excluded.  No frank interstitial edema. No pneumothorax.  Cardiomegaly.  IMPRESSION: Left lung base is poorly visualized. A left basilar opacity is not excluded. Consider PA/ lateral chest radiographs further evaluation.   Electronically Signed   By: Julian Hy M.D.   On: 05/28/2013 11:20   Dg Chest Port 1v Same Day  05/28/2013   CLINICAL DATA:  Central line placement  EXAM: PORTABLE CHEST - 1 VIEW SAME DAY  COMPARISON:  Portable exam 1252 hr compared to 1107 hr.  FINDINGS: New left subclavian central venous catheter with tip projecting over SVC.  Enlargement of cardiac silhouette.  Atherosclerotic calcification aorta.  Chronic accentuation of interstitial markings in both lungs.  Cannot exclude bibasilar infiltrates.  No gross pleural effusion or pneumothorax.  IMPRESSION: Enlargement of cardiac silhouette.  No pneumothorax following central line placement.  Bibasilar opacities cannot exclude infiltrate.   Electronically Signed   By: Lavonia Dana M.D.   On: 05/28/2013 13:20    Anti-infectives: Anti-infectives   Start     Dose/Rate Route Frequency Ordered Stop   05/29/13 1230  vancomycin (VANCOCIN) 1,250 mg in sodium chloride 0.9 % 250 mL IVPB     1,250 mg 166.7 mL/hr over 90 Minutes Intravenous Every 24 hours 05/28/13 1656     05/28/13 2000  piperacillin-tazobactam (ZOSYN) IVPB 3.375 g     3.375 g 12.5 mL/hr over 240 Minutes Intravenous 3 times per day 05/28/13 1656     05/28/13 1730  vancomycin (VANCOCIN) 500 mg in sodium chloride 0.9 % 100 mL IVPB     500 mg 100 mL/hr over 60 Minutes Intravenous NOW 05/28/13 1656 05/28/13 1830   05/28/13 1515  piperacillin-tazobactam (ZOSYN) IVPB 3.375 g     3.375 g 100 mL/hr over 30 Minutes Intravenous  Once 05/28/13 1507 05/28/13 1623   05/28/13 1515  vancomycin (VANCOCIN) IVPB 1000 mg/200 mL premix  Status:  Discontinued     1,000 mg 200 mL/hr over 60 Minutes Intravenous  Once 05/28/13 1507 05/28/13 1516   05/28/13 1100   piperacillin-tazobactam (ZOSYN) IVPB 3.375 g     3.375 g 100 mL/hr over 30 Minutes Intravenous  Once 05/28/13 1050 05/28/13 1227   05/28/13 1100  vancomycin (VANCOCIN) IVPB 1000 mg/200 mL premix     1,000 mg 200 mL/hr over 60 Minutes Intravenous  Once 05/28/13 1050 05/28/13 1335      Assessment/Plan: s/p * No surgery found * Acute cholecystitis s/p percutaneous cholecystostomy tube Continue antibiotics until WBC normal May begin PO's Patient will keep drain for several weeks, then have a tube cholangiogram to determine if it is ready to be removed.  It is possible that he will not need cholecystectomy, considering his comorbidities. Will follow.   LOS: 1 day    Marsha Gundlach K. 05/29/2013

## 2013-05-29 NOTE — Progress Notes (Signed)
Patient ID: Drew Reed, male   DOB: June 05, 1920, 79 y.o.   MRN: [id number]    Subjective: Pt feels ok today.  Less pain after placement of perc chole drain.  Hungry.  Objective: Vital signs in last 24 hours: Temp:  [97.5 F (36.4 C)-103.2 F (39.6 C)] 97.9 F (36.6 C) (10/02 0349) Pulse Rate:  [58-109] 68 (10/02 0800) Resp:  [12-32] 24 (10/02 0800) BP: (72-127)/(42-80) 108/61 mmHg (10/02 0800) SpO2:  [92 %-100 %] 96 % (10/02 0800) Weight:  [197 lb 5 oz (89.5 kg)] 197 lb 5 oz (89.5 kg) (10/01 1450) Last BM Date: 05/27/13  Intake/Output from previous day: 10/01 0701 - 10/02 0700 In: 2948.2 [I.V.:2748.2; IV Piggyback:200] Out: 605 [Urine:605] Intake/Output this shift: Total I/O In: 15 [I.V.:15] Out: -   PE: Abd: soft, appropriately tender in RUQ after drain placement, JP drain with bilious output, +BS, ND Heart: irregular Lungs: CTAB  Lab Results:   Recent Labs  05/28/13 1535 05/29/13 0500  WBC 27.1* 16.9*  HGB 12.5* 12.5*  HCT 35.8* 35.9*  PLT 222 232   BMET  Recent Labs  05/28/13 1535 05/29/13 0500  NA 137 141  K 3.3* 3.2*  CL 106 111  CO2 16* 18*  GLUCOSE 121* 124*  BUN 33* 32*  CREATININE 1.46* 1.13  CALCIUM 7.7* 7.8*   PT/INR  Recent Labs  05/28/13 1535  LABPROT 17.8*  INR 1.51*   CMP     Component Value Date/Time   NA 141 05/29/2013 0500   K 3.2* 05/29/2013 0500   CL 111 05/29/2013 0500   CO2 18* 05/29/2013 0500   GLUCOSE 124* 05/29/2013 0500   BUN 32* 05/29/2013 0500   CREATININE 1.13 05/29/2013 0500   CALCIUM 7.8* 05/29/2013 0500   PROT 5.6* 05/28/2013 1535   ALBUMIN 2.7* 05/28/2013 1535   AST 63* 05/28/2013 1535   ALT 42 05/28/2013 1535   ALKPHOS 78 05/28/2013 1535   BILITOT 2.1* 05/28/2013 1535   GFRNONAA 55* 05/29/2013 0500   GFRAA 64* 05/29/2013 0500   Lipase  No results found for this basename: lipase       Studies/Results: Ct Abdomen Pelvis Wo Contrast  05/28/2013   CLINICAL DATA:  Septate, weakness, fatigue, history  hypertension  EXAM: CT ABDOMEN AND PELVIS WITHOUT CONTRAST  TECHNIQUE: Multidetector CT imaging of the abdomen and pelvis was performed following the standard protocol without intravenous contrast. Sagittal and coronal MPR images reconstructed from axial data set. Oral contrast not administered.  COMPARISON:  None  FINDINGS: Minimal bibasilar atelectasis.  Two right lung base nodules, 7 mm diameter image 10 and 11 mm diameter image 11.  Distended gallbladder with thickened wall and pericholecystic edema highly suspicious for acute cholecystitis.  No definite biliary tract calcifications identified.  Calcified granulomata within spleen.  Tiny calcified granuloma at upper pole right kidney image 23.  Within limits of a nonenhanced exam, no additional focal abnormalities of the liver, spleen, pancreas, kidneys, or adrenal glands otherwise identified.  Supraumbilical ventral hernia containing fat.  Right inguinal hernia containing nonobstructed segment of sigmoid colon.  Scattered mild colonic diverticulosis distally.  Atherosclerotic calcifications including coronary arteries.  Stomach and bowel loops otherwise normal appearance.  Bladder decompressed by Foley catheter with enlarged prostate gland noted.  No mass, adenopathy, free fluid, or free air.  Bones demineralized.  IMPRESSION: Distended gallbladder with thickened gallbladder wall and pericholecystic edema highly suspicious for acute cholecystitis.  Right inguinal hernia containing a nonobstructed segment of sigmoid colon.  Supraumbilical ventral  hernia containing fat.  Nonobstructing right renal calculus.  Two nonspecific nodules are seen at the right lung base, 7 mm and 11 mm in size, the metastatic disease not excluded with this appearance; recommend followup CT imaging of the chest when the patient's condition permits.   Electronically Signed   By: Lavonia Dana M.D.   On: 05/28/2013 12:43   Dg Chest Port 1 View  05/29/2013   CLINICAL DATA:  The respiratory  failure.  EXAM: PORTABLE CHEST - 1 VIEW  COMPARISON:  05/28/2013  FINDINGS: Insert COPD cardiomegaly with vascular congestion. Improving aeration in the lung bases. Left central line is unchanged.  IMPRESSION: Cardiomegaly, COPD. Improving bibasilar opacities.   Electronically Signed   By: Rolm Baptise M.D.   On: 05/29/2013 06:23   Dg Chest Port 1 View  05/28/2013   CLINICAL DATA:  Fatigue, weakness  EXAM: PORTABLE CHEST - 1 VIEW  COMPARISON:  None.  FINDINGS: Left lung base is poorly visualized. A left basilar opacity is not excluded.  No frank interstitial edema. No pneumothorax.  Cardiomegaly.  IMPRESSION: Left lung base is poorly visualized. A left basilar opacity is not excluded. Consider PA/ lateral chest radiographs further evaluation.   Electronically Signed   By: Julian Hy M.D.   On: 05/28/2013 11:20   Dg Chest Port 1v Same Day  05/28/2013   CLINICAL DATA:  Central line placement  EXAM: PORTABLE CHEST - 1 VIEW SAME DAY  COMPARISON:  Portable exam 1252 hr compared to 1107 hr.  FINDINGS: New left subclavian central venous catheter with tip projecting over SVC.  Enlargement of cardiac silhouette.  Atherosclerotic calcification aorta.  Chronic accentuation of interstitial markings in both lungs.  Cannot exclude bibasilar infiltrates.  No gross pleural effusion or pneumothorax.  IMPRESSION: Enlargement of cardiac silhouette.  No pneumothorax following central line placement.  Bibasilar opacities cannot exclude infiltrate.   Electronically Signed   By: Lavonia Dana M.D.   On: 05/28/2013 13:20    Anti-infectives: Anti-infectives   Start     Dose/Rate Route Frequency Ordered Stop   05/29/13 1230  vancomycin (VANCOCIN) 1,250 mg in sodium chloride 0.9 % 250 mL IVPB     1,250 mg 166.7 mL/hr over 90 Minutes Intravenous Every 24 hours 05/28/13 1656     05/28/13 2000  piperacillin-tazobactam (ZOSYN) IVPB 3.375 g     3.375 g 12.5 mL/hr over 240 Minutes Intravenous 3 times per day 05/28/13 1656      05/28/13 1730  vancomycin (VANCOCIN) 500 mg in sodium chloride 0.9 % 100 mL IVPB     500 mg 100 mL/hr over 60 Minutes Intravenous NOW 05/28/13 1656 05/28/13 1830   05/28/13 1515  piperacillin-tazobactam (ZOSYN) IVPB 3.375 g     3.375 g 100 mL/hr over 30 Minutes Intravenous  Once 05/28/13 1507 05/28/13 1623   05/28/13 1515  vancomycin (VANCOCIN) IVPB 1000 mg/200 mL premix  Status:  Discontinued     1,000 mg 200 mL/hr over 60 Minutes Intravenous  Once 05/28/13 1507 05/28/13 1516   05/28/13 1100  piperacillin-tazobactam (ZOSYN) IVPB 3.375 g     3.375 g 100 mL/hr over 30 Minutes Intravenous  Once 05/28/13 1050 05/28/13 1227   05/28/13 1100  vancomycin (VANCOCIN) IVPB 1000 mg/200 mL premix     1,000 mg 200 mL/hr over 60 Minutes Intravenous  Once 05/28/13 1050 05/28/13 1335       Assessment/Plan  1. Acute cholecystitis, s/p perc drain 2. UTI 3. New onset A fib Patient Active Problem  List   Diagnosis Date Noted  . Septic shock 05/28/2013  . UTI (urinary tract infection) 05/28/2013  . Cholecystitis, acute 05/28/2013  . Chronic kidney disease (CKD), stage IV (severe) 05/28/2013   Plan: 1. Cont perc chole drain.  Patient's WBC down from 27K to 16K.  Cont abx therapy for now. 2. Advance diet as tolerates 3. Will follow.   LOS: 1 day    Nomar Broad E 05/29/2013, 8:06 AM Pager: [phone]

## 2013-05-30 DIAGNOSIS — A4150 Gram-negative sepsis, unspecified: Secondary | ICD-10-CM | POA: Diagnosis not present

## 2013-05-30 LAB — BASIC METABOLIC PANEL
BUN: 35 mg/dL — ABNORMAL HIGH (ref 6–23)
CO2: 19 mEq/L (ref 19–32)
Chloride: 107 mEq/L (ref 96–112)
Creatinine, Ser: 0.95 mg/dL (ref 0.50–1.35)
GFR calc Af Amer: 82 mL/min — ABNORMAL LOW (ref >90)
GFR calc non Af Amer: 71 mL/min — ABNORMAL LOW (ref >90)
Glucose, Bld: 112 mg/dL — ABNORMAL HIGH (ref 70–99)
Potassium: 3.6 mEq/L (ref 3.5–5.1)

## 2013-05-30 LAB — CBC
HCT: 35.1 % — ABNORMAL LOW (ref 39.0–52.0)
Hemoglobin: 12 g/dL — ABNORMAL LOW (ref 13.0–17.0)
MCV: 89.5 fL (ref 78.0–100.0)
WBC: 14 10*3/uL — ABNORMAL HIGH (ref 4.0–10.5)

## 2013-05-30 LAB — URINE CULTURE
Colony Count: NO GROWTH
Culture: NO GROWTH

## 2013-05-30 LAB — TSH: TSH: 4.346 u[IU]/mL (ref 0.350–4.500)

## 2013-05-30 MED ORDER — VANCOMYCIN HCL 10 G IV SOLR
1250.0000 mg | INTRAVENOUS | Status: DC
  Administered 2013-05-30: 1250 mg via INTRAVENOUS
  Filled 2013-05-30 (×2): qty 1250

## 2013-05-30 MED ORDER — FAMOTIDINE 20 MG PO TABS
20.0000 mg | ORAL_TABLET | Freq: Two times a day (BID) | ORAL | Status: DC
  Administered 2013-05-30 – 2013-05-31 (×3): 20 mg via ORAL
  Filled 2013-05-30 (×5): qty 1

## 2013-05-30 NOTE — Progress Notes (Signed)
CRITICAL VALUE ALERT  Critical value received:  Blood cultures positive for gram positive cocci   Date of notification:  05/30/2013  Time of notification:  2115  Critical value read back:yes  Nurse who received alert:  Hadassah Pais RN   MD notified (1st page):  Donnal Debar NP  Time of first page:  2117  MD notified (2nd page):  Time of second page:  Responding MD:  Donnal Debar NP  Time MD responded:  2120

## 2013-05-30 NOTE — Progress Notes (Signed)
Subjective: Feels well, had a bm, ate food  Objective: Vital signs in last 24 hours: Temp:  [97.4 F (36.3 C)-98.1 F (36.7 C)] 97.9 F (36.6 C) (10/03 0408) Pulse Rate:  [63-82] 81 (10/03 0500) Resp:  [16-31] 16 (10/03 0500) BP: (96-127)/(52-73) 125/66 mmHg (10/03 0500) SpO2:  [91 %-96 %] 95 % (10/03 0500) Last BM Date: 05/29/13  Intake/Output from previous day: 10/02 0701 - 10/03 0700 In: 1451.3 [I.V.:851.3; IV Piggyback:600] Out: 913 [Urine:823; Drains:90] Intake/Output this shift:    GI: soft drain in place with bilious fluid, tender only around drain  Lab Results:   Recent Labs  05/28/13 1535 05/29/13 0500  WBC 27.1* 16.9*  HGB 12.5* 12.5*  HCT 35.8* 35.9*  PLT 222 232   BMET  Recent Labs  05/29/13 0500 05/30/13 0300  NA 141 136  K 3.2* 3.6  CL 111 107  CO2 18* 19  GLUCOSE 124* 112*  BUN 32* 35*  CREATININE 1.13 0.95  CALCIUM 7.8* 8.2*   PT/INR  Recent Labs  05/28/13 1535  LABPROT 17.8*  INR 1.51*   ABG No results found for this basename: PHART, PCO2, PO2, HCO3,  in the last 72 hours  Studies/Results: Ct Abdomen Pelvis Wo Contrast  05/28/2013   CLINICAL DATA:  Septate, weakness, fatigue, history hypertension  EXAM: CT ABDOMEN AND PELVIS WITHOUT CONTRAST  TECHNIQUE: Multidetector CT imaging of the abdomen and pelvis was performed following the standard protocol without intravenous contrast. Sagittal and coronal MPR images reconstructed from axial data set. Oral contrast not administered.  COMPARISON:  None  FINDINGS: Minimal bibasilar atelectasis.  Two right lung base nodules, 7 mm diameter image 10 and 11 mm diameter image 11.  Distended gallbladder with thickened wall and pericholecystic edema highly suspicious for acute cholecystitis.  No definite biliary tract calcifications identified.  Calcified granulomata within spleen.  Tiny calcified granuloma at upper pole right kidney image 23.  Within limits of a nonenhanced exam, no additional focal  abnormalities of the liver, spleen, pancreas, kidneys, or adrenal glands otherwise identified.  Supraumbilical ventral hernia containing fat.  Right inguinal hernia containing nonobstructed segment of sigmoid colon.  Scattered mild colonic diverticulosis distally.  Atherosclerotic calcifications including coronary arteries.  Stomach and bowel loops otherwise normal appearance.  Bladder decompressed by Foley catheter with enlarged prostate gland noted.  No mass, adenopathy, free fluid, or free air.  Bones demineralized.  IMPRESSION: Distended gallbladder with thickened gallbladder wall and pericholecystic edema highly suspicious for acute cholecystitis.  Right inguinal hernia containing a nonobstructed segment of sigmoid colon.  Supraumbilical ventral hernia containing fat.  Nonobstructing right renal calculus.  Two nonspecific nodules are seen at the right lung base, 7 mm and 11 mm in size, the metastatic disease not excluded with this appearance; recommend followup CT imaging of the chest when the patient's condition permits.   Electronically Signed   By: Lavonia Dana M.D.   On: 05/28/2013 12:43   Dg Chest Port 1 View  05/29/2013   CLINICAL DATA:  The respiratory failure.  EXAM: PORTABLE CHEST - 1 VIEW  COMPARISON:  05/28/2013  FINDINGS: Insert COPD cardiomegaly with vascular congestion. Improving aeration in the lung bases. Left central line is unchanged.  IMPRESSION: Cardiomegaly, COPD. Improving bibasilar opacities.   Electronically Signed   By: Rolm Baptise M.D.   On: 05/29/2013 06:23   Dg Chest Port 1 View  05/28/2013   CLINICAL DATA:  Fatigue, weakness  EXAM: PORTABLE CHEST - 1 VIEW  COMPARISON:  None.  FINDINGS: Left lung base is poorly visualized. A left basilar opacity is not excluded.  No frank interstitial edema. No pneumothorax.  Cardiomegaly.  IMPRESSION: Left lung base is poorly visualized. A left basilar opacity is not excluded. Consider PA/ lateral chest radiographs further evaluation.    Electronically Signed   By: Julian Hy M.D.   On: 05/28/2013 11:20   Dg Chest Port 1v Same Day  05/28/2013   CLINICAL DATA:  Central line placement  EXAM: PORTABLE CHEST - 1 VIEW SAME DAY  COMPARISON:  Portable exam 1252 hr compared to 1107 hr.  FINDINGS: New left subclavian central venous catheter with tip projecting over SVC.  Enlargement of cardiac silhouette.  Atherosclerotic calcification aorta.  Chronic accentuation of interstitial markings in both lungs.  Cannot exclude bibasilar infiltrates.  No gross pleural effusion or pneumothorax.  IMPRESSION: Enlargement of cardiac silhouette.  No pneumothorax following central line placement.  Bibasilar opacities cannot exclude infiltrate.   Electronically Signed   By: Lavonia Dana M.D.   On: 05/28/2013 13:20    Anti-infectives: Anti-infectives   Start     Dose/Rate Route Frequency Ordered Stop   05/29/13 1400  piperacillin-tazobactam (ZOSYN) IVPB 3.375 g     3.375 g 12.5 mL/hr over 240 Minutes Intravenous 3 times per day 05/29/13 1150     05/29/13 1230  vancomycin (VANCOCIN) 1,250 mg in sodium chloride 0.9 % 250 mL IVPB     1,250 mg 166.7 mL/hr over 90 Minutes Intravenous Every 24 hours 05/28/13 1656     05/28/13 2000  piperacillin-tazobactam (ZOSYN) IVPB 3.375 g  Status:  Discontinued     3.375 g 12.5 mL/hr over 240 Minutes Intravenous 3 times per day 05/28/13 1656 05/29/13 1147   05/28/13 1730  vancomycin (VANCOCIN) 500 mg in sodium chloride 0.9 % 100 mL IVPB     500 mg 100 mL/hr over 60 Minutes Intravenous NOW 05/28/13 1656 05/28/13 1830   05/28/13 1515  piperacillin-tazobactam (ZOSYN) IVPB 3.375 g     3.375 g 100 mL/hr over 30 Minutes Intravenous  Once 05/28/13 1507 05/28/13 1623   05/28/13 1515  vancomycin (VANCOCIN) IVPB 1000 mg/200 mL premix  Status:  Discontinued     1,000 mg 200 mL/hr over 60 Minutes Intravenous  Once 05/28/13 1507 05/28/13 1516   05/28/13 1100  piperacillin-tazobactam (ZOSYN) IVPB 3.375 g     3.375 g 100  mL/hr over 30 Minutes Intravenous  Once 05/28/13 1050 05/28/13 1227   05/28/13 1100  vancomycin (VANCOCIN) IVPB 1000 mg/200 mL premix     1,000 mg 200 mL/hr over 60 Minutes Intravenous  Once 05/28/13 1050 05/28/13 1335      Assessment/Plan: Cholecystitis s/p cholecystostomy Continue antibiotics, no wbc yet today, cultures with gnr On regular diet Patient will keep drain for six weeks, then have a tube cholangiogram to determine if it is ready to be removed. It is possible that he will not need cholecystectomy, considering his comorbidities.     [facility] 05/30/2013

## 2013-05-30 NOTE — Progress Notes (Signed)
Subjective: Perc chole drain placed 10/1 Doing well   Objective: Vital signs in last 24 hours: Temp:  [97.4 F (36.3 C)-98.1 F (36.7 C)] 97.9 F (36.6 C) (10/03 0835) Pulse Rate:  [63-82] 81 (10/03 0500) Resp:  [16-31] 16 (10/03 0500) BP: (96-127)/(52-73) 125/66 mmHg (10/03 0500) SpO2:  [91 %-95 %] 95 % (10/03 0500) Last BM Date: 05/29/13  Intake/Output from previous day: 10/02 0701 - 10/03 0700 In: 1711.3 [I.V.:1111.3; IV Piggyback:600] Out: 913 [Urine:823; Drains:90] Intake/Output this shift: Total I/O In: 75 [I.V.:75] Out: -   PE:  Afeb; vss Output 90 cc yesterday; 20 cc in JP Output bile Site clean and dry; NT Wbc 14  Lab Results:   Recent Labs  05/29/13 0500 05/30/13 0850  WBC 16.9* 14.0*  HGB 12.5* 12.0*  HCT 35.9* 35.1*  PLT 232 213   BMET  Recent Labs  05/29/13 0500 05/30/13 0300  NA 141 136  K 3.2* 3.6  CL 111 107  CO2 18* 19  GLUCOSE 124* 112*  BUN 32* 35*  CREATININE 1.13 0.95  CALCIUM 7.8* 8.2*   PT/INR  Recent Labs  05/28/13 1535  LABPROT 17.8*  INR 1.51*   ABG No results found for this basename: PHART, PCO2, PO2, HCO3,  in the last 72 hours  Studies/Results: Ct Abdomen Pelvis Wo Contrast  05/28/2013   CLINICAL DATA:  Septate, weakness, fatigue, history hypertension  EXAM: CT ABDOMEN AND PELVIS WITHOUT CONTRAST  TECHNIQUE: Multidetector CT imaging of the abdomen and pelvis was performed following the standard protocol without intravenous contrast. Sagittal and coronal MPR images reconstructed from axial data set. Oral contrast not administered.  COMPARISON:  None  FINDINGS: Minimal bibasilar atelectasis.  Two right lung base nodules, 7 mm diameter image 10 and 11 mm diameter image 11.  Distended gallbladder with thickened wall and pericholecystic edema highly suspicious for acute cholecystitis.  No definite biliary tract calcifications identified.  Calcified granulomata within spleen.  Tiny calcified granuloma at upper pole  right kidney image 23.  Within limits of a nonenhanced exam, no additional focal abnormalities of the liver, spleen, pancreas, kidneys, or adrenal glands otherwise identified.  Supraumbilical ventral hernia containing fat.  Right inguinal hernia containing nonobstructed segment of sigmoid colon.  Scattered mild colonic diverticulosis distally.  Atherosclerotic calcifications including coronary arteries.  Stomach and bowel loops otherwise normal appearance.  Bladder decompressed by Foley catheter with enlarged prostate gland noted.  No mass, adenopathy, free fluid, or free air.  Bones demineralized.  IMPRESSION: Distended gallbladder with thickened gallbladder wall and pericholecystic edema highly suspicious for acute cholecystitis.  Right inguinal hernia containing a nonobstructed segment of sigmoid colon.  Supraumbilical ventral hernia containing fat.  Nonobstructing right renal calculus.  Two nonspecific nodules are seen at the right lung base, 7 mm and 11 mm in size, the metastatic disease not excluded with this appearance; recommend followup CT imaging of the chest when the patient's condition permits.   Electronically Signed   By: Lavonia Dana M.D.   On: 05/28/2013 12:43   Dg Chest Port 1 View  05/29/2013   CLINICAL DATA:  The respiratory failure.  EXAM: PORTABLE CHEST - 1 VIEW  COMPARISON:  05/28/2013  FINDINGS: Insert COPD cardiomegaly with vascular congestion. Improving aeration in the lung bases. Left central line is unchanged.  IMPRESSION: Cardiomegaly, COPD. Improving bibasilar opacities.   Electronically Signed   By: Rolm Baptise M.D.   On: 05/29/2013 06:23   Dg Chest Port 1 View  05/28/2013   CLINICAL  DATA:  Fatigue, weakness  EXAM: PORTABLE CHEST - 1 VIEW  COMPARISON:  None.  FINDINGS: Left lung base is poorly visualized. A left basilar opacity is not excluded.  No frank interstitial edema. No pneumothorax.  Cardiomegaly.  IMPRESSION: Left lung base is poorly visualized. A left basilar opacity is  not excluded. Consider PA/ lateral chest radiographs further evaluation.   Electronically Signed   By: Julian Hy M.D.   On: 05/28/2013 11:20   Dg Chest Port 1v Same Day  05/28/2013   CLINICAL DATA:  Central line placement  EXAM: PORTABLE CHEST - 1 VIEW SAME DAY  COMPARISON:  Portable exam 1252 hr compared to 1107 hr.  FINDINGS: New left subclavian central venous catheter with tip projecting over SVC.  Enlargement of cardiac silhouette.  Atherosclerotic calcification aorta.  Chronic accentuation of interstitial markings in both lungs.  Cannot exclude bibasilar infiltrates.  No gross pleural effusion or pneumothorax.  IMPRESSION: Enlargement of cardiac silhouette.  No pneumothorax following central line placement.  Bibasilar opacities cannot exclude infiltrate.   Electronically Signed   By: Lavonia Dana M.D.   On: 05/28/2013 13:20    Anti-infectives: Anti-infectives   Start     Dose/Rate Route Frequency Ordered Stop   05/29/13 1400  piperacillin-tazobactam (ZOSYN) IVPB 3.375 g     3.375 g 12.5 mL/hr over 240 Minutes Intravenous 3 times per day 05/29/13 1150     05/29/13 1230  vancomycin (VANCOCIN) 1,250 mg in sodium chloride 0.9 % 250 mL IVPB     1,250 mg 166.7 mL/hr over 90 Minutes Intravenous Every 24 hours 05/28/13 1656     05/28/13 2000  piperacillin-tazobactam (ZOSYN) IVPB 3.375 g  Status:  Discontinued     3.375 g 12.5 mL/hr over 240 Minutes Intravenous 3 times per day 05/28/13 1656 05/29/13 1147   05/28/13 1730  vancomycin (VANCOCIN) 500 mg in sodium chloride 0.9 % 100 mL IVPB     500 mg 100 mL/hr over 60 Minutes Intravenous NOW 05/28/13 1656 05/28/13 1830   05/28/13 1515  piperacillin-tazobactam (ZOSYN) IVPB 3.375 g     3.375 g 100 mL/hr over 30 Minutes Intravenous  Once 05/28/13 1507 05/28/13 1623   05/28/13 1515  vancomycin (VANCOCIN) IVPB 1000 mg/200 mL premix  Status:  Discontinued     1,000 mg 200 mL/hr over 60 Minutes Intravenous  Once 05/28/13 1507 05/28/13 1516    05/28/13 1100  piperacillin-tazobactam (ZOSYN) IVPB 3.375 g     3.375 g 100 mL/hr over 30 Minutes Intravenous  Once 05/28/13 1050 05/28/13 1227   05/28/13 1100  vancomycin (VANCOCIN) IVPB 1000 mg/200 mL premix     1,000 mg 200 mL/hr over 60 Minutes Intravenous  Once 05/28/13 1050 05/28/13 1335      Assessment/Plan: s/p * No surgery found *  Perc chole drain placed 10/1 Need to remain in place 6 -8 weeks Pt doing well   LOS: 2 days    Ashliegh Parekh A 05/30/2013

## 2013-05-30 NOTE — Progress Notes (Signed)
ANTIBIOTIC CONSULT NOTE - Follow Up  Pharmacy Consult for Vancomycin and zosyn Indication: Sepsis  No Known Allergies  Patient Measurements: Height: 6\' 3"  (190.5 cm) Weight: 197 lb 5 oz (89.5 kg) IBW/kg (Calculated) : 84.5   Vital Signs: Temp: 98.5 F (36.9 C) (10/03 2100) Temp src: Oral (10/03 1300) BP: 134/61 mmHg (10/03 2100) Pulse Rate: 86 (10/03 2100) Intake/Output from previous day: 10/02 0701 - 10/03 0700 In: 1711.3 [I.V.:1111.3; IV Piggyback:600] Out: 913 [Urine:823; Drains:90] Intake/Output from this shift: Total I/O In: -  Out: 100 [Urine:100]  Labs:  Recent Labs  05/28/13 1535 05/29/13 0500 05/30/13 0300 05/30/13 0850  WBC 27.1* 16.9*  --  14.0*  HGB 12.5* 12.5*  --  12.0*  PLT 222 232  --  213  CREATININE 1.46* 1.13 0.95  --    Estimated Creatinine Clearance: 61.8 ml/min (by C-G formula based on Cr of 0.95). No results found for this basename: VANCOTROUGH, Corlis Leak, VANCORANDOM, GENTTROUGH, GENTPEAK, GENTRANDOM, TOBRATROUGH, TOBRAPEAK, TOBRARND, AMIKACINPEAK, AMIKACINTROU, AMIKACIN,  in the last 72 hours   Microbiology: Recent Results (from the past 720 hour(s))  CULTURE, BLOOD (ROUTINE X 2)     Status: None   Collection Time    05/28/13  9:57 AM      Result Value Range Status   Specimen Description BLOOD RIGHT ANTECUBITAL   Final   Special Requests BOTTLES DRAWN AEROBIC AND ANAEROBIC 10CC   Final   Culture NO GROWTH 2 DAYS   Final   Report Status PENDING   Incomplete  CULTURE, BLOOD (ROUTINE X 2)     Status: None   Collection Time    05/28/13 10:04 AM      Result Value Range Status   Specimen Description BLOOD LEFT FOREARM   Final   Special Requests BOTTLES DRAWN AEROBIC AND ANAEROBIC 8CC   Final   Culture     Final   Value: GRAM NEGATIVE COCCI     Gram Stain Report Called to,Read Back By and Verified With: GUFFEY,A(MC) ON 05/30/13 AT 2115 BY LOY,C     PERFORMED AT [facility]   Report Status PENDING   Incomplete  URINE CULTURE     Status: None    Collection Time    05/28/13 11:25 AM      Result Value Range Status   Specimen Description URINE, CATHETERIZED   Final   Special Requests NONE   Final   Culture  Setup Time     Final   Value: 05/28/2013 12:45     Performed at [facility] Count     Final   Value: NO GROWTH     Performed at [facility]   Culture     Final   Value: NO GROWTH     Performed at [facility]   Report Status 05/30/2013 FINAL   Final  MRSA PCR SCREENING     Status: None   Collection Time    05/28/13  3:32 PM      Result Value Range Status   MRSA by PCR NEGATIVE  NEGATIVE Final   Comment:            The GeneXpert MRSA Assay (FDA     approved for NASAL specimens     only), is one component of a     comprehensive MRSA colonization     surveillance program. It is not     intended to diagnose MRSA     infection nor to guide or  monitor treatment for     MRSA infections.  CULTURE, BLOOD (ROUTINE X 2)     Status: None   Collection Time    05/28/13  3:35 PM      Result Value Range Status   Specimen Description BLOOD LEFT ARM   Final   Special Requests BOTTLES DRAWN AEROBIC ONLY 4CC   Final   Culture  Setup Time     Final   Value: 05/28/2013 21:24     Performed at [facility]   Culture     Final   Value:        BLOOD CULTURE RECEIVED NO GROWTH TO DATE CULTURE WILL BE HELD FOR 5 DAYS BEFORE ISSUING A FINAL NEGATIVE REPORT     Performed at [facility]   Report Status PENDING   Incomplete  CULTURE, BLOOD (ROUTINE X 2)     Status: None   Collection Time    05/28/13  3:52 PM      Result Value Range Status   Specimen Description BLOOD LEFT HAND   Final   Special Requests BOTTLES DRAWN AEROBIC ONLY 10CC   Final   Culture  Setup Time     Final   Value: 05/28/2013 21:24     Performed at [facility]   Culture     Final   Value:        BLOOD CULTURE RECEIVED NO GROWTH TO DATE CULTURE WILL BE HELD FOR 5 DAYS BEFORE ISSUING A FINAL NEGATIVE REPORT      Performed at [facility]   Report Status PENDING   Incomplete  CULTURE, ROUTINE-ABSCESS     Status: None   Collection Time    05/28/13  6:48 PM      Result Value Range Status   Specimen Description ABSCESS GALL BLADDER   Final   Special Requests NONE   Final   Gram Stain     Final   Value: ABUNDANT WBC PRESENT, PREDOMINANTLY PMN     NO SQUAMOUS EPITHELIAL CELLS SEEN     ABUNDANT GRAM NEGATIVE RODS     Performed at [facility]   Culture     Final   Value: ABUNDANT GRAM NEGATIVE RODS     Performed at [facility]   Report Status PENDING   Incomplete    Medical History: Past Medical History  Diagnosis Date  . Hypertension   . Thyroid disease     Medications:  Prescriptions prior to admission  Medication Sig Dispense Refill  . finasteride (PROSCAR) 5 MG tablet Take 5 mg by mouth daily.      Marland Kitchen levothyroxine (SYNTHROID, LEVOTHROID) 25 MCG tablet Take 25 mcg by mouth daily before breakfast.      . lisinopril (PRINIVIL,ZESTRIL) 5 MG tablet Take 2.5 mg by mouth daily.      . metoprolol tartrate (LOPRESSOR) 25 MG tablet Take 25 mg by mouth 2 (two) times daily. Hold if systolic pressure is less than 130.      . tamsulosin (FLOMAX) 0.4 MG CAPS capsule Take 0.4 mg by mouth daily.       Scheduled:  . famotidine  20 mg Oral BID  . piperacillin-tazobactam (ZOSYN)  IV  3.375 g Intravenous Q8H  . vancomycin  1,250 mg Intravenous Q24H   Assessment: 79 y.o male transferred from [facility] for sepsis likely due to acute cholecystitis and UTI.  Bcx grew GNR, Wound Cx grew GNR - pt currently on Zosyn 3.375gm q8  EI - dose ok.  WBC trending down, afebrile.  TRH called tonight with cx results from [facility] saying [facility] - restart vancomycin.  - f/u BC results  Goal of Therapy:  Vancomycin trough level 15-20 mcg/ml  Plan:  Zosyn 3.375 g IV q8h  (infuse each dose over 4 hours). Vancomycin 1250mg  Q24hr  Bonnita Nasuti Pharm.D. CPP, BCPS Clinical  Pharmacist [phone] 05/30/2013 10:24 PM

## 2013-05-30 NOTE — Progress Notes (Signed)
PULMONARY  / CRITICAL CARE MEDICINE  Name: Drew Reed MRN: [id number] DOB: 1920/05/01    ADMISSION DATE:  05/28/2013 CONSULTATION DATE:  05/28/2013  REFERRING MD :  Jola Schmidt PRIMARY SERVICE: Critical care  CHIEF COMPLAINT:  Weakness  BRIEF PATIENT DESCRIPTION: Drew Reed is a 79 y/o male transferred from [facility] for sepsis likely due to acute cholecystitis and UTI.  SIGNIFICANT EVENTS / STUDIES:  10/1- transferred to [facility] MICU 10/1 EKG with RBB and new-onset afib 10/1 Started on levophed 10/1 Percutaneous cholecystostomy placed by IR 10/2- pressors weened off  LINES / TUBES: L IJ 10/1>>> Foley catheter 10/1>>> Percutaneous cholecystostomy tube 10/1>>>  CULTURES: Blood Cx X 2 10/1>>> NGTD Urine Culture 10/1>>>No growth cholecystotomy culture 10/1>>> Gram negative rods>>>  ANTIBIOTICS: Vancomycin 10/1>>>10/3 Zosyn 10/1>>>  SUBJECTIVE: No acute events overnight. Abdominal pain when he coughs.   VITAL SIGNS: Temp:  [97.4 F (36.3 C)-98.1 F (36.7 C)] 97.9 F (36.6 C) (10/03 0408) Pulse Rate:  [58-82] 81 (10/03 0500) Resp:  [16-31] 16 (10/03 0500) BP: (96-127)/(52-73) 125/66 mmHg (10/03 0500) SpO2:  [91 %-96 %] 95 % (10/03 0500) HEMODYNAMICS:  CVP:  [7 mmHg-14 mmHg] 9 mmHg VENTILATOR SETTINGS: N/A   INTAKE / OUTPUT: Intake/Output     10/02 0701 - 10/03 0700   I.V. (mL/kg) 851.3 (9.5)   IV Piggyback 600   Total Intake(mL/kg) 1451.3 (16.2)   Urine (mL/kg/hr) 823 (0.4)   Drains 90 (0)   Total Output 913   Net +538.3       Urine Occurrence 1 x   Stool Occurrence 3 x     PHYSICAL EXAMINATION: General:  NAD, alert and interactive Neuro:  A&O, no gross deficits HEENT:  MMM Cardiovascular:  Irregularly irregular rhythm, no murmur, distant heart sounds as he is barrel chested Lungs:  CTAB, reduced lung sounds Abdomen:  Soft, tender to palpation of RUQ and epigastrium.  Musculoskeletal:  No edema, SCDs in place Skin:  Intact without  evident lesions  LABS:  CBC Recent Labs     05/28/13  0958  05/28/13  1535  05/29/13  0500  WBC  16.3*  27.1*  16.9*  HGB  13.6  12.5*  12.5*  HCT  39.3  35.8*  35.9*  PLT  214  222  232   Coag's Recent Labs     05/28/13  1535  APTT  31  INR  1.51*   BMET Recent Labs     05/28/13  1535  05/29/13  0500  05/30/13  0300  NA  137  141  136  K  3.3*  3.2*  3.6  CL  106  111  107  CO2  16*  18*  19  BUN  33*  32*  35*  CREATININE  1.46*  1.13  0.95  GLUCOSE  121*  124*  112*   Electrolytes Recent Labs     05/28/13  1535  05/29/13  0500  05/30/13  0300  CALCIUM  7.7*  7.8*  8.2*  MG  1.4*  1.5   --   PHOS  2.8  3.5   --    Sepsis Markers Recent Labs     05/28/13  0958  PROCALCITON  68.49   ABG No results found for this basename: PHART, PCO2ART, PO2ART,  in the last 72 hours Liver Enzymes Recent Labs     05/28/13  0958  05/28/13  1535  AST  65*  63*  ALT  36  42  ALKPHOS  84  78  BILITOT  2.6*  2.1*  ALBUMIN  2.9*  2.7*   Cardiac Enzymes Recent Labs     05/28/13  0958  05/28/13  1552  05/28/13  2105  05/29/13  0305  TROPONINI  <0.30  <0.30  <0.30  <0.30  PROBNP   --   5066.0*   --    --    Glucose Recent Labs     05/28/13  1542  05/28/13  1918  GLUCAP  115*  119*    Imaging Ct Abdomen Pelvis Wo Contrast  05/28/2013   CLINICAL DATA:  Septate, weakness, fatigue, history hypertension  EXAM: CT ABDOMEN AND PELVIS WITHOUT CONTRAST  TECHNIQUE: Multidetector CT imaging of the abdomen and pelvis was performed following the standard protocol without intravenous contrast. Sagittal and coronal MPR images reconstructed from axial data set. Oral contrast not administered.  COMPARISON:  None  FINDINGS: Minimal bibasilar atelectasis.  Two right lung base nodules, 7 mm diameter image 10 and 11 mm diameter image 11.  Distended gallbladder with thickened wall and pericholecystic edema highly suspicious for acute cholecystitis.  No definite biliary tract  calcifications identified.  Calcified granulomata within spleen.  Tiny calcified granuloma at upper pole right kidney image 23.  Within limits of a nonenhanced exam, no additional focal abnormalities of the liver, spleen, pancreas, kidneys, or adrenal glands otherwise identified.  Supraumbilical ventral hernia containing fat.  Right inguinal hernia containing nonobstructed segment of sigmoid colon.  Scattered mild colonic diverticulosis distally.  Atherosclerotic calcifications including coronary arteries.  Stomach and bowel loops otherwise normal appearance.  Bladder decompressed by Foley catheter with enlarged prostate gland noted.  No mass, adenopathy, free fluid, or free air.  Bones demineralized.  IMPRESSION: Distended gallbladder with thickened gallbladder wall and pericholecystic edema highly suspicious for acute cholecystitis.  Right inguinal hernia containing a nonobstructed segment of sigmoid colon.  Supraumbilical ventral hernia containing fat.  Nonobstructing right renal calculus.  Two nonspecific nodules are seen at the right lung base, 7 mm and 11 mm in size, the metastatic disease not excluded with this appearance; recommend followup CT imaging of the chest when the patient's condition permits.   Electronically Signed   By: Lavonia Dana M.D.   On: 05/28/2013 12:43   Dg Chest Port 1 View  05/29/2013   CLINICAL DATA:  The respiratory failure.  EXAM: PORTABLE CHEST - 1 VIEW  COMPARISON:  05/28/2013  FINDINGS: Insert COPD cardiomegaly with vascular congestion. Improving aeration in the lung bases. Left central line is unchanged.  IMPRESSION: Cardiomegaly, COPD. Improving bibasilar opacities.   Electronically Signed   By: Rolm Baptise M.D.   On: 05/29/2013 06:23   Dg Chest Port 1 View  05/28/2013   CLINICAL DATA:  Fatigue, weakness  EXAM: PORTABLE CHEST - 1 VIEW  COMPARISON:  None.  FINDINGS: Left lung base is poorly visualized. A left basilar opacity is not excluded.  No frank interstitial edema. No  pneumothorax.  Cardiomegaly.  IMPRESSION: Left lung base is poorly visualized. A left basilar opacity is not excluded. Consider PA/ lateral chest radiographs further evaluation.   Electronically Signed   By: Julian Hy M.D.   On: 05/28/2013 11:20   Dg Chest Port 1v Same Day  05/28/2013   CLINICAL DATA:  Central line placement  EXAM: PORTABLE CHEST - 1 VIEW SAME DAY  COMPARISON:  Portable exam 1252 hr compared to 1107 hr.  FINDINGS: New left subclavian central venous catheter with tip projecting  over SVC.  Enlargement of cardiac silhouette.  Atherosclerotic calcification aorta.  Chronic accentuation of interstitial markings in both lungs.  Cannot exclude bibasilar infiltrates.  No gross pleural effusion or pneumothorax.  IMPRESSION: Enlargement of cardiac silhouette.  No pneumothorax following central line placement.  Bibasilar opacities cannot exclude infiltrate.   Electronically Signed   By: Lavonia Dana M.D.   On: 05/28/2013 13:20   ASSESSMENT / PLAN:  PULMONARY A: With barrel chest consider COPD- emphysema, but no former diagnosis P:   - O2 via [place] to maintain sats above 88% - consider trial bronchodilators as O2 replaced overnight for desat to 90% -ambulate  CARDIOVASCULAR A:  New onset Afib, rate controlled Hypotension 2/2 septic shock, SIRS post drain likely- resolved with pressure 125/66 ACS ruled out No Rel AI TTE with EF 45-50%, mild tricuspid regurg P:  - monitor CVPs, 9-14, dc them - Possibly need anticoagulation but will defer for 24 hours post procedure and risks fall?  Risk /benefit ratio? - Hold home BB and ACEi - TSH pending -dc line -may need cards eval  RENAL A:   Hx of renal failure needing HD after obstructing stone Likely pre-renal AKI on CKD, but baseline GFR unknown, Cre improved to  Volume status improved Non ag hyperchloremic acidosis P:   - 1/2 NS at 75 - kvo, or Sl lock, when line out - avoid nephrotoxins  GASTROINTESTINAL A:   Acute  cholecystitis s/p perc drainage placement  P:   - General surgery and IR consulted - heart healthy diet  HEMATOLOGIC A:  dvt prevention, fib new P:  - Needs anticoagulation as above, will defer - scd  INFECTIOUS A:   Acute cholecystitis-gram neg rods P:   - Vanc and zosyn per pharm, consider dc vanc - Blood cultures pending - drain culture with gram negative rods, narrow off zosyn in am pending culture ID gram neg rod - urine culture negative  ENDOCRINE A:   No known problems P:   - Cortisol 32, no need for additional steroids  NEUROLOGIC A:   No known problems P:   - monitor mental status with sepsis -PT consult  TODAY'S SUMMARY: Continue broad spectrum antibiotics, plan to transfer to tele. (new fib, rate controlled), narrow abx after gram neg rod noted  To triad, floor  Laroy Apple, MD [place] Resident, PGY-2 05/30/2013, 6:40 AM  Lavon Paganini. Titus Mould, MD, [place] Pgr: [facility]

## 2013-05-31 DIAGNOSIS — I10 Essential (primary) hypertension: Secondary | ICD-10-CM | POA: Diagnosis present

## 2013-05-31 DIAGNOSIS — R7881 Bacteremia: Secondary | ICD-10-CM

## 2013-05-31 DIAGNOSIS — I4891 Unspecified atrial fibrillation: Secondary | ICD-10-CM

## 2013-05-31 DIAGNOSIS — I5022 Chronic systolic (congestive) heart failure: Secondary | ICD-10-CM | POA: Diagnosis present

## 2013-05-31 DIAGNOSIS — E876 Hypokalemia: Secondary | ICD-10-CM

## 2013-05-31 LAB — BASIC METABOLIC PANEL
BUN: 24 mg/dL — ABNORMAL HIGH (ref 6–23)
CO2: 19 mEq/L (ref 19–32)
GFR calc non Af Amer: 74 mL/min — ABNORMAL LOW (ref >90)
Glucose, Bld: 89 mg/dL (ref 70–99)
Potassium: 3.2 mEq/L — ABNORMAL LOW (ref 3.5–5.1)
Sodium: 137 mEq/L (ref 135–145)

## 2013-05-31 LAB — CULTURE, ROUTINE-ABSCESS

## 2013-05-31 LAB — MAGNESIUM: Magnesium: 1.7 mg/dL (ref 1.5–2.5)

## 2013-05-31 MED ORDER — LISINOPRIL 2.5 MG PO TABS
2.5000 mg | ORAL_TABLET | Freq: Every day | ORAL | Status: DC
  Administered 2013-05-31 – 2013-06-02 (×3): 2.5 mg via ORAL
  Filled 2013-05-31 (×3): qty 1

## 2013-05-31 MED ORDER — POTASSIUM CHLORIDE 10 MEQ/100ML IV SOLN
10.0000 meq | INTRAVENOUS | Status: AC
  Administered 2013-05-31 (×4): 10 meq via INTRAVENOUS
  Filled 2013-05-31 (×4): qty 100

## 2013-05-31 MED ORDER — TAMSULOSIN HCL 0.4 MG PO CAPS
0.4000 mg | ORAL_CAPSULE | Freq: Every day | ORAL | Status: DC
  Administered 2013-05-31 – 2013-06-02 (×3): 0.4 mg via ORAL
  Filled 2013-05-31 (×3): qty 1

## 2013-05-31 MED ORDER — ENSURE COMPLETE PO LIQD
237.0000 mL | Freq: Three times a day (TID) | ORAL | Status: DC
  Administered 2013-05-31 – 2013-06-02 (×5): 237 mL via ORAL

## 2013-05-31 MED ORDER — METOPROLOL TARTRATE 12.5 MG HALF TABLET
12.5000 mg | ORAL_TABLET | Freq: Two times a day (BID) | ORAL | Status: DC
  Administered 2013-05-31 – 2013-06-02 (×5): 12.5 mg via ORAL
  Filled 2013-05-31 (×6): qty 1

## 2013-05-31 MED ORDER — FINASTERIDE 5 MG PO TABS
5.0000 mg | ORAL_TABLET | Freq: Every day | ORAL | Status: DC
  Administered 2013-05-31 – 2013-06-02 (×3): 5 mg via ORAL
  Filled 2013-05-31 (×3): qty 1

## 2013-05-31 MED ORDER — LEVOTHYROXINE SODIUM 25 MCG PO TABS
25.0000 ug | ORAL_TABLET | Freq: Every day | ORAL | Status: DC
  Administered 2013-06-01 – 2013-06-02 (×2): 25 ug via ORAL
  Filled 2013-05-31 (×3): qty 1

## 2013-05-31 NOTE — Progress Notes (Addendum)
Chart reviewed.  [facility] PROGRESS NOTE  MINUS BRIGNER [id number] DOB: [birth year] DOA: 05/28/2013 PCP: Senaida Lange. MD, pt cant remember name  Assessment/Plan:    Septic shock resolved    Cholecystitis, acute: s/p perc drain.  Leave in for 6 weeks, tube cholangeogram. F/u IR. cx show Klebsiella sensitive to most    Bacteremia 1/4 from 10/1 growing Gram negative cocci.  Continue zosyn until resulted.    UTI (urinary tract infection): cx neg    Atrial fibrillation:  New?  Chads score 3.  Appears quite weak, lives alone, and unclear whether anticoag candidate.  Cannot even remember PCP's name.  Await PT eval.  Rate controlled.    Hypokalemia:  Replete IV.  Check mag    Chronic systolic heart failure    Essential hypertension, benign  Hypothyroidism:  Resume synthroid.  Consultants:  Surgery  IR  Procedures:  Left IJ CVL d/c'd  Perc cholecystostomy  Antibiotics:  zosyn  HPI/Subjective: No appetite.  No pain.  No CP or dyspnea.  Reports previous h/o CHF and renal failure  Objective: Filed Vitals:   05/31/13 0500  BP: 136/83  Pulse: 90  Temp: 98.6 F (37 C)  Resp: 20    Intake/Output Summary (Last 24 hours) at 05/31/13 1336 Last data filed at 05/31/13 1203  Gross per 24 hour  Intake    240 ml  Output   1025 ml  Net   -785 ml   Filed Weights   05/28/13 1450  Weight: 89.5 kg (197 lb 5 oz)    Exam:   General:  Weak appearing. Oriented and appropriate  HEENT:  Slightly dry MM  Cardiovascular:  irreg irreg  Respiratory: CTA without WRR  Abdomen: S, NT, ND. Drain functioning  Ext:  No cce  Data Reviewed: Basic Metabolic Panel:  Recent Labs Lab 05/28/13 0958 05/28/13 1535 05/29/13 0500 05/30/13 0300 05/31/13 0416  NA 138 137 141 136 137  K 3.2* 3.3* 3.2* 3.6 3.2*  CL 104 106 111 107 106  CO2 25 16* 18* 19 19  GLUCOSE 106* 121* 124* 112* 89  BUN 34* 33* 32* 35* 24*  CREATININE 1.89* 1.46* 1.13 0.95 0.86  CALCIUM 8.8 7.7* 7.8*  8.2* 7.9*  MG  --  1.4* 1.5  --   --   PHOS  --  2.8 3.5  --   --    Liver Function Tests:  Recent Labs Lab 05/28/13 0958 05/28/13 1535  AST 65* 63*  ALT 36 42  ALKPHOS 84 78  BILITOT 2.6* 2.1*  PROT 5.9* 5.6*  ALBUMIN 2.9* 2.7*   No results found for this basename: LIPASE, AMYLASE,  in the last 168 hours No results found for this basename: AMMONIA,  in the last 168 hours CBC:  Recent Labs Lab 05/28/13 0958 05/28/13 1535 05/29/13 0500 05/30/13 0850  WBC 16.3* 27.1* 16.9* 14.0*  NEUTROABS 14.9*  --   --   --   HGB 13.6 12.5* 12.5* 12.0*  HCT 39.3 35.8* 35.9* 35.1*  MCV 91.6 90.2 89.8 89.5  PLT 214 222 232 213   Cardiac Enzymes:  Recent Labs Lab 05/28/13 0958 05/28/13 1552 05/28/13 2105 05/29/13 0305  TROPONINI <0.30 <0.30 <0.30 <0.30   BNP (last 3 results)  Recent Labs  05/28/13 1552  PROBNP 5066.0*   CBG:  Recent Labs Lab 05/28/13 1542 05/28/13 1918  GLUCAP 115* 119*    Recent Results (from the past 240 hour(s))  CULTURE, BLOOD (ROUTINE X 2)  Status: None   Collection Time    05/28/13  9:57 AM      Result Value Range Status   Specimen Description BLOOD RIGHT ANTECUBITAL   Final   Special Requests BOTTLES DRAWN AEROBIC AND ANAEROBIC 10CC   Final   Culture NO GROWTH 3 DAYS   Final   Report Status PENDING   Incomplete  CULTURE, BLOOD (ROUTINE X 2)     Status: None   Collection Time    05/28/13 10:04 AM      Result Value Range Status   Specimen Description BLOOD LEFT FOREARM   Final   Special Requests BOTTLES DRAWN AEROBIC AND ANAEROBIC 8CC   Final   Culture     Final   Value: GRAM NEGATIVE COCCI     Gram Stain Report Called to,Read Back By and Verified With: GUFFEY,A(MC) ON 05/30/13 AT 2115 BY LOY,C     PERFORMED AT [facility]   Report Status PENDING   Incomplete  URINE CULTURE     Status: None   Collection Time    05/28/13 11:25 AM      Result Value Range Status   Specimen Description URINE, CATHETERIZED   Final   Special Requests NONE    Final   Culture  Setup Time     Final   Value: 05/28/2013 12:45     Performed at [facility]     Final   Value: NO GROWTH     Performed at [facility]   Culture     Final   Value: NO GROWTH     Performed at [facility]   Report Status 05/30/2013 FINAL   Final  MRSA PCR SCREENING     Status: None   Collection Time    05/28/13  3:32 PM      Result Value Range Status   MRSA by PCR NEGATIVE  NEGATIVE Final   Comment:            The GeneXpert MRSA Assay (FDA     approved for NASAL specimens     only), is one component of a     comprehensive MRSA colonization     surveillance program. It is not     intended to diagnose MRSA     infection nor to guide or     monitor treatment for     MRSA infections.  CULTURE, BLOOD (ROUTINE X 2)     Status: None   Collection Time    05/28/13  3:35 PM      Result Value Range Status   Specimen Description BLOOD LEFT ARM   Final   Special Requests BOTTLES DRAWN AEROBIC ONLY 4CC   Final   Culture  Setup Time     Final   Value: 05/28/2013 21:24     Performed at [facility]   Culture     Final   Value:        BLOOD CULTURE RECEIVED NO GROWTH TO DATE CULTURE WILL BE HELD FOR 5 DAYS BEFORE ISSUING A FINAL NEGATIVE REPORT     Performed at [facility]   Report Status PENDING   Incomplete  CULTURE, BLOOD (ROUTINE X 2)     Status: None   Collection Time    05/28/13  3:52 PM      Result Value Range Status   Specimen Description BLOOD LEFT HAND   Final   Special Requests BOTTLES DRAWN AEROBIC ONLY 10CC  Final   Culture  Setup Time     Final   Value: 05/28/2013 21:24     Performed at [facility]   Culture     Final   Value:        BLOOD CULTURE RECEIVED NO GROWTH TO DATE CULTURE WILL BE HELD FOR 5 DAYS BEFORE ISSUING A FINAL NEGATIVE REPORT     Performed at [facility]   Report Status PENDING   Incomplete  CULTURE, ROUTINE-ABSCESS     Status: None   Collection Time     05/28/13  6:48 PM      Result Value Range Status   Specimen Description ABSCESS GALL BLADDER   Final   Special Requests NONE   Final   Gram Stain     Final   Value: ABUNDANT WBC PRESENT, PREDOMINANTLY PMN     NO SQUAMOUS EPITHELIAL CELLS SEEN     ABUNDANT GRAM NEGATIVE RODS     Performed at [facility]   Culture     Final   Value: ABUNDANT KLEBSIELLA PNEUMONIAE     Performed at [facility]   Report Status 05/31/2013 FINAL   Final   Organism ID, Bacteria KLEBSIELLA PNEUMONIAE   Final     Studies: No results found.  Scheduled Meds: . famotidine  20 mg Oral BID  . piperacillin-tazobactam (ZOSYN)  IV  3.375 g Intravenous Q8H   Continuous Infusions:    Time spent: 35 min  [facility] Pager [phone]. If 7PM-7AM, please contact night-coverage at [email or web address], password [facility] 05/31/2013, 1:36 PM  LOS: 3 days

## 2013-05-31 NOTE — Progress Notes (Signed)
  Subjective: No complaints. Tolerating diet.   Objective: Vital signs in last 24 hours: Temp:  [97.8 F (36.6 C)-98.6 F (37 C)] 98.6 F (37 C) (10/04 0500) Pulse Rate:  [72-90] 90 (10/04 0500) Resp:  [20-25] 20 (10/04 0500) BP: (132-148)/(61-95) 136/83 mmHg (10/04 0500) SpO2:  [92 %-96 %] 94 % (10/04 0500) Last BM Date: 05/30/13  Intake/Output from previous day: 10/03 0701 - 10/04 0700 In: 465 [P.O.:240; I.V.:225] Out: 890 [Urine:800; Drains:90] Intake/Output this shift:    GI: perc drain with bile.  abdomen soft no pain.  Lab Results:   Recent Labs  05/29/13 0500 05/30/13 0850  WBC 16.9* 14.0*  HGB 12.5* 12.0*  HCT 35.9* 35.1*  PLT 232 213   BMET  Recent Labs  05/30/13 0300 05/31/13 0416  NA 136 137  K 3.6 3.2*  CL 107 106  CO2 19 19  GLUCOSE 112* 89  BUN 35* 24*  CREATININE 0.95 0.86  CALCIUM 8.2* 7.9*   PT/INR  Recent Labs  05/28/13 1535  LABPROT 17.8*  INR 1.51*   ABG No results found for this basename: PHART, PCO2, PO2, HCO3,  in the last 72 hours  Studies/Results: No results found.  Anti-infectives: Anti-infectives   Start     Dose/Rate Route Frequency Ordered Stop   05/30/13 2300  vancomycin (VANCOCIN) 1,250 mg in sodium chloride 0.9 % 250 mL IVPB     1,250 mg 166.7 mL/hr over 90 Minutes Intravenous Every 24 hours 05/30/13 2209     05/29/13 1400  piperacillin-tazobactam (ZOSYN) IVPB 3.375 g     3.375 g 12.5 mL/hr over 240 Minutes Intravenous 3 times per day 05/29/13 1150     05/29/13 1230  vancomycin (VANCOCIN) 1,250 mg in sodium chloride 0.9 % 250 mL IVPB  Status:  Discontinued     1,250 mg 166.7 mL/hr over 90 Minutes Intravenous Every 24 hours 05/28/13 1656 05/30/13 1031   05/28/13 2000  piperacillin-tazobactam (ZOSYN) IVPB 3.375 g  Status:  Discontinued     3.375 g 12.5 mL/hr over 240 Minutes Intravenous 3 times per day 05/28/13 1656 05/29/13 1147   05/28/13 1730  vancomycin (VANCOCIN) 500 mg in sodium chloride 0.9 % 100 mL  IVPB     500 mg 100 mL/hr over 60 Minutes Intravenous NOW 05/28/13 1656 05/28/13 1830   05/28/13 1515  piperacillin-tazobactam (ZOSYN) IVPB 3.375 g     3.375 g 100 mL/hr over 30 Minutes Intravenous  Once 05/28/13 1507 05/28/13 1623   05/28/13 1515  vancomycin (VANCOCIN) IVPB 1000 mg/200 mL premix  Status:  Discontinued     1,000 mg 200 mL/hr over 60 Minutes Intravenous  Once 05/28/13 1507 05/28/13 1516   05/28/13 1100  piperacillin-tazobactam (ZOSYN) IVPB 3.375 g     3.375 g 100 mL/hr over 30 Minutes Intravenous  Once 05/28/13 1050 05/28/13 1227   05/28/13 1100  vancomycin (VANCOCIN) IVPB 1000 mg/200 mL premix     1,000 mg 200 mL/hr over 60 Minutes Intravenous  Once 05/28/13 1050 05/28/13 1335      Assessment/Plan:  LOS: 3 days  Acute cholecystitis s/p perc drain due to comorbidities doing well Can be D/C when clear with medicine.   Keep tube for 6 weeks then IR will need to inject to determine patency of cystic duct and possible removal at that time.  May not need cholecystectomy.   Aaran Enberg A. 05/31/2013

## 2013-05-31 NOTE — Evaluation (Signed)
Physical Therapy Evaluation Patient Details Name: Drew Reed MRN: [id number] DOB: 03-22-1920 Today's Date: 05/31/2013 Time: [id number] PT Time Calculation (min): 27 min  PT Assessment / Plan / Recommendation History of Present Illness  Pt presened from [facility] with UTI and sepsis from cholecystitis, underwent cholecystostomy, now with JP drain.  Clinical Impression  Pt presents with generalized weakness and decreased stamina with increased mobility, DOE 2/4. Pt unsteady without AD but ambulates with min-guard A with RW. RW left in room and pt encouraged to ambulate with nursing staff at least 3x/ day. PT will follow acutely to help pt increase mobility, strength, and safety for d/c home. Recommend HHPT upon d/c.    PT Assessment  Patient needs continued PT services    Follow Up Recommendations  Home health PT    Does the patient have the potential to tolerate intense rehabilitation      Barriers to Discharge        Equipment Recommendations  None recommended by PT    Recommendations for Other Services     Frequency Min 3X/week    Precautions / Restrictions Precautions Precautions: Fall Restrictions Weight Bearing Restrictions: No   Pertinent Vitals/Pain No c/o pain      Mobility  Bed Mobility Bed Mobility: Supine to Sit;Sitting - Scoot to Edge of Bed Supine to Sit: 7: Independent;HOB flat Sitting - Scoot to Edge of Bed: 7: Independent Transfers Transfers: Sit to Stand;Stand to Sit Sit to Stand: From bed;From toilet;4: Min guard Stand to Sit: 4: Min guard;To toilet;To bed Details for Transfer Assistance: min-guard A for safety but pt without LOB, no dizziness, no physical assist needed. Grab bar used to rise from low toilet Ambulation/Gait Ambulation/Gait Assistance: 4: Min guard Ambulation Distance (Feet): 250 Feet Assistive device: Rolling walker Ambulation/Gait Assistance Details: pt ambulated safely with RW but short distance in room without AD, needed RW for  stability. DOE 2/4 with ambulation. Gait Pattern: Step-through pattern;Decreased stride length Gait velocity: decreased Stairs: No Wheelchair Mobility Wheelchair Mobility: No    Exercises     PT Diagnosis: Generalized weakness  PT Problem List: Decreased strength;Decreased activity tolerance;Decreased balance;Decreased mobility;Decreased knowledge of precautions PT Treatment Interventions: DME instruction;Gait training;Stair training;Functional mobility training;Therapeutic activities;Therapeutic exercise;Balance training;Patient/family education     PT Goals(Current goals can be found in the care plan section) Acute Rehab PT Goals Patient Stated Goal: return home PT Goal Formulation: With patient Time For Goal Achievement: 06/14/13 Potential to Achieve Goals: Good  Visit Information  Last PT Received On: 05/31/13 Assistance Needed: +1 History of Present Illness: Pt presened from [facility] with UTI and sepsis from cholecystitis, underwent cholecystostomy, now with JP drain.       Prior [place] expects to be discharged to:: Private residence Living Arrangements: Spouse/significant other Available Help at Discharge: Family;Available PRN/intermittently Type of Home: House Home Access: Stairs to enter [employer] of Steps: 4 Entrance Stairs-Rails: Right Home Layout: Two level;Able to live on main level with bedroom/bathroom Home Equipment: Youth worker - 4 wheels;Cane - single point Additional Comments: prior to becoming sick, was not using equipment and was independent with mobility Prior Function Level of Independence: Independent Comments: pt still drives some, wife drives most of time. Good family support of children and grandchildren Communication Communication: No difficulties    Cognition  Cognition Arousal/Alertness: Awake/alert Behavior During Therapy: WFL for tasks assessed/performed Overall Cognitive Status:  Within Functional Limits for tasks assessed    Extremity/Trunk Assessment Upper Extremity Assessment Upper Extremity Assessment: Overall  WFL for tasks assessed Lower Extremity Assessment Lower Extremity Assessment: Generalized weakness Cervical / Trunk Assessment Cervical / Trunk Assessment: Normal   Balance Balance Balance Assessed: Yes Dynamic Standing Balance Dynamic Standing - Balance Support: During functional activity;No upper extremity supported Dynamic Standing - Level of Assistance: 4: Min assist  End of Session PT - End of Session Equipment Utilized During Treatment: Gait belt Activity Tolerance: Patient tolerated treatment well Patient left: in bed;with call bell/phone within reach Nurse Communication: Mobility status  GP   Leighton Roach, [place]  [place], [place] 05/31/2013, 2:28 PM

## 2013-06-01 LAB — BASIC METABOLIC PANEL
BUN: 18 mg/dL (ref 6–23)
Calcium: 8.2 mg/dL — ABNORMAL LOW (ref 8.4–10.5)
Chloride: 103 mEq/L (ref 96–112)
Creatinine, Ser: 0.92 mg/dL (ref 0.50–1.35)
GFR calc Af Amer: 84 mL/min — ABNORMAL LOW (ref >90)
GFR calc non Af Amer: 72 mL/min — ABNORMAL LOW (ref >90)
Glucose, Bld: 96 mg/dL (ref 70–99)

## 2013-06-01 LAB — CBC
HCT: 40.4 % (ref 39.0–52.0)
Hemoglobin: 13.9 g/dL (ref 13.0–17.0)
MCH: 30.7 pg (ref 26.0–34.0)
MCHC: 34.4 g/dL (ref 30.0–36.0)
MCV: 89.2 fL (ref 78.0–100.0)
Platelets: 304 10*3/uL (ref 150–400)
RDW: 13.7 % (ref 11.5–15.5)
WBC: 16.4 10*3/uL — ABNORMAL HIGH (ref 4.0–10.5)

## 2013-06-01 MED ORDER — CIPROFLOXACIN IN D5W 400 MG/200ML IV SOLN
400.0000 mg | Freq: Two times a day (BID) | INTRAVENOUS | Status: DC
  Administered 2013-06-01 – 2013-06-02 (×3): 400 mg via INTRAVENOUS
  Filled 2013-06-01 (×4): qty 200

## 2013-06-01 MED ORDER — OFF THE BEAT BOOK
Freq: Once | Status: AC
Start: 2013-06-01 — End: 2013-06-02
  Administered 2013-06-02: 06:00:00
  Filled 2013-06-01: qty 1

## 2013-06-01 MED ORDER — POTASSIUM CHLORIDE CRYS ER 10 MEQ PO TBCR
10.0000 meq | EXTENDED_RELEASE_TABLET | Freq: Two times a day (BID) | ORAL | Status: DC
  Administered 2013-06-01 – 2013-06-02 (×3): 10 meq via ORAL
  Filled 2013-06-01 (×4): qty 1

## 2013-06-01 NOTE — Progress Notes (Signed)
FOLLOW UP WITH ccs.  3- 4 WEEKS  DRAIN CARE

## 2013-06-01 NOTE — Progress Notes (Signed)
[facility] PROGRESS NOTE  Drew Reed [id number] DOB: May 29, 1920 DOA: 05/28/2013 PCP: Senaida Lange. MD, pt cant remember name  Assessment/Plan:    Septic shock resolved    Cholecystitis, acute: s/p perc drain.  Leave in for 6 weeks, tube cholangeogram. F/u IR. cx show Klebsiella sensitive to most.  Appetite marginal.    Bacteremia 1/4 from 10/1:  Lab now reports GNR, which makes more sense.  Likely klebsiella.  Change to IV cipro pending final results.  Keep in hospital until results back.    UTI (urinary tract infection): cx neg    Atrial fibrillation:  New?  Chads score 3.  PT eval reports poor balance, rec home PT.  Not a coumadin candidate at this time.  Will start asa 325 once eating better.    Hypokalemia:  Resolved.  Mag ok    Chronic systolic heart failure compensated    Essential hypertension, benign  Hypothyroidism:  Resume synthroid.  Dispo:  Home with home PT once stable and cultures back.  Wife requests referral to new PCP in [place]  Consultants:  Surgery  IR  Procedures:  Left IJ CVL d/c'd  Perc cholecystostomy  Antibiotics:  zosyn  HPI/Subjective: "I'm getting depressed staying here!"  Objective: Filed Vitals:   06/01/13 0517  BP: 147/79  Pulse: 87  Temp: 98.6 F (37 C)  Resp: 18    Intake/Output Summary (Last 24 hours) at 06/01/13 1124 Last data filed at 06/01/13 [id number]  Gross per 24 hour  Intake    240 ml  Output   1050 ml  Net   -810 ml   Filed Weights   05/28/13 1450  Weight: 89.5 kg (197 lb 5 oz)   tele:  NSR, rate controlled  Exam:   General:  Brighter today, more cantankerous  HEENT:  Slightly dry MM  Cardiovascular:  irreg irreg  Respiratory: CTA without WRR  Abdomen: S, NT, ND. Drain functioning  Ext:  No cce  Data Reviewed: Basic Metabolic Panel:  Recent Labs Lab 05/28/13 0958 05/28/13 1535 05/29/13 0500 05/30/13 0300 05/31/13 0416 06/01/13 0611  NA 138 137 141 136 137 136  K 3.2* 3.3*  3.2* 3.6 3.2* 3.6  CL 104 106 111 107 106 103  CO2 25 16* 18* 19 19 21   GLUCOSE 106* 121* 124* 112* 89 96  BUN 34* 33* 32* 35* 24* 18  CREATININE 1.89* 1.46* 1.13 0.95 0.86 0.92  CALCIUM 8.8 7.7* 7.8* 8.2* 7.9* 8.2*  MG  --  1.4* 1.5  --  1.7  --   PHOS  --  2.8 3.5  --   --   --    Liver Function Tests:  Recent Labs Lab 05/28/13 0958 05/28/13 1535  AST 65* 63*  ALT 36 42  ALKPHOS 84 78  BILITOT 2.6* 2.1*  PROT 5.9* 5.6*  ALBUMIN 2.9* 2.7*   No results found for this basename: LIPASE, AMYLASE,  in the last 168 hours No results found for this basename: AMMONIA,  in the last 168 hours CBC:  Recent Labs Lab 05/28/13 0958 05/28/13 1535 05/29/13 0500 05/30/13 0850 06/01/13 0611  WBC 16.3* 27.1* 16.9* 14.0* 16.4*  NEUTROABS 14.9*  --   --   --   --   HGB 13.6 12.5* 12.5* 12.0* 13.9  HCT 39.3 35.8* 35.9* 35.1* 40.4  MCV 91.6 90.2 89.8 89.5 89.2  PLT 214 222 232 213 304   Cardiac Enzymes:  Recent Labs Lab 05/28/13 0958 05/28/13 1552 05/28/13 2105 05/29/13 0305  TROPONINI <0.30 <0.30 <0.30 <0.30   BNP (last 3 results)  Recent Labs  05/28/13 1552  PROBNP 5066.0*   CBG:  Recent Labs Lab 05/28/13 1542 05/28/13 1918  GLUCAP 115* 119*    Recent Results (from the past 240 hour(s))  CULTURE, BLOOD (ROUTINE X 2)     Status: None   Collection Time    05/28/13  9:57 AM      Result Value Range Status   Specimen Description BLOOD RIGHT ANTECUBITAL   Final   Special Requests BOTTLES DRAWN AEROBIC AND ANAEROBIC 10CC   Final   Culture NO GROWTH 3 DAYS   Final   Report Status PENDING   Incomplete  CULTURE, BLOOD (ROUTINE X 2)     Status: None   Collection Time    05/28/13 10:04 AM      Result Value Range Status   Specimen Description BLOOD LEFT FOREARM   Final   Special Requests BOTTLES DRAWN AEROBIC AND ANAEROBIC 8CC   Final   Culture  Setup Time     Final   Value: 05/31/2013 22:29     Performed at [facility]   Culture     Final   Value: GRAM  NEGATIVE RODS     Note: PREVIOUSLY REPORTED AS GRAM NEGATIVE COCCI Performed at [place]: [place] 06/01/13 @ 9:21AM BY RUSCOE A.     Performed at [facility]   Report Status PENDING   Incomplete  URINE CULTURE     Status: None   Collection Time    05/28/13 11:25 AM      Result Value Range Status   Specimen Description URINE, CATHETERIZED   Final   Special Requests NONE   Final   Culture  Setup Time     Final   Value: 05/28/2013 12:45     Performed at [place]     Final   Value: NO GROWTH     Performed at [facility]   Culture     Final   Value: NO GROWTH     Performed at [facility]   Report Status 05/30/2013 FINAL   Final  MRSA PCR SCREENING     Status: None   Collection Time    05/28/13  3:32 PM      Result Value Range Status   MRSA by PCR NEGATIVE  NEGATIVE Final   Comment:            The GeneXpert MRSA Assay (FDA     approved for NASAL specimens     only), is one component of a     comprehensive MRSA colonization     surveillance program. It is not     intended to diagnose MRSA     infection nor to guide or     monitor treatment for     MRSA infections.  CULTURE, BLOOD (ROUTINE X 2)     Status: None   Collection Time    05/28/13  3:35 PM      Result Value Range Status   Specimen Description BLOOD LEFT ARM   Final   Special Requests BOTTLES DRAWN AEROBIC ONLY 4CC   Final   Culture  Setup Time     Final   Value: 05/28/2013 21:24     Performed at [facility]   Culture     Final   Value:  BLOOD CULTURE RECEIVED NO GROWTH TO DATE CULTURE WILL BE HELD FOR 5 DAYS BEFORE ISSUING A FINAL NEGATIVE REPORT     Performed at [facility]   Report Status PENDING   Incomplete  CULTURE, BLOOD (ROUTINE X 2)     Status: None   Collection Time    05/28/13  3:52 PM      Result Value Range Status   Specimen Description BLOOD LEFT HAND   Final   Special Requests  BOTTLES DRAWN AEROBIC ONLY 10CC   Final   Culture  Setup Time     Final   Value: 05/28/2013 21:24     Performed at [facility]   Culture     Final   Value:        BLOOD CULTURE RECEIVED NO GROWTH TO DATE CULTURE WILL BE HELD FOR 5 DAYS BEFORE ISSUING A FINAL NEGATIVE REPORT     Performed at [facility]   Report Status PENDING   Incomplete  CULTURE, ROUTINE-ABSCESS     Status: None   Collection Time    05/28/13  6:48 PM      Result Value Range Status   Specimen Description ABSCESS GALL BLADDER   Final   Special Requests NONE   Final   Gram Stain     Final   Value: ABUNDANT WBC PRESENT, PREDOMINANTLY PMN     NO SQUAMOUS EPITHELIAL CELLS SEEN     ABUNDANT GRAM NEGATIVE RODS     Performed at [facility]   Culture     Final   Value: ABUNDANT KLEBSIELLA PNEUMONIAE     Performed at [facility]   Report Status 05/31/2013 FINAL   Final   Organism ID, Bacteria KLEBSIELLA PNEUMONIAE   Final     Studies: No results found.  Scheduled Meds: . feeding supplement  237 mL Oral TID BM  . finasteride  5 mg Oral Daily  . levothyroxine  25 mcg Oral QAC breakfast  . lisinopril  2.5 mg Oral Daily  . metoprolol tartrate  12.5 mg Oral BID  . piperacillin-tazobactam (ZOSYN)  IV  3.375 g Intravenous Q8H  . tamsulosin  0.4 mg Oral Daily   Continuous Infusions:    Time spent: 35 min  [facility] Pager [phone]. If 7PM-7AM, please contact night-coverage at [email or web address], password [facility] 06/01/2013, 11:24 AM  LOS: 4 days

## 2013-06-01 NOTE — Progress Notes (Signed)
Subjective: Denies abdominal pain.  Tolerating diet.  Drain in place.  Objective: Vital signs in last 24 hours: Temp:  [98.4 F (36.9 C)-99.1 F (37.3 C)] 98.6 F (37 C) (10/05 0517) Pulse Rate:  [74-87] 87 (10/05 0517) Resp:  [18] 18 (10/05 0517) BP: (130-147)/(79-88) 147/79 mmHg (10/05 0517) SpO2:  [93 %-94 %] 93 % (10/05 0517) Last BM Date: 05/31/13  Intake/Output from previous day: 10/04 0701 - 10/05 0700 In: 480 [P.O.:480] Out: 1000 [Urine:925; Drains:75] Intake/Output this shift: Total I/O In: -  Out: 50 [Drains:50]   Physical Exam General: alert, awake and oriented.  NAD Abd: +bs, abdomen is soft round and non tender.  RUQ drain in place with bilious output.   Lab Results:   Recent Labs  05/30/13 0850 06/01/13 0611  WBC 14.0* 16.4*  HGB 12.0* 13.9  HCT 35.1* 40.4  PLT 213 304   BMET  Recent Labs  05/31/13 0416 06/01/13 0611  NA 137 136  K 3.2* 3.6  CL 106 103  CO2 19 21  GLUCOSE 89 96  BUN 24* 18  CREATININE 0.86 0.92  CALCIUM 7.9* 8.2*    Anti-infectives: Anti-infectives   Start     Dose/Rate Route Frequency Ordered Stop   05/30/13 2300  vancomycin (VANCOCIN) 1,250 mg in sodium chloride 0.9 % 250 mL IVPB  Status:  Discontinued     1,250 mg 166.7 mL/hr over 90 Minutes Intravenous Every 24 hours 05/30/13 2209 05/31/13 0811   05/29/13 1400  piperacillin-tazobactam (ZOSYN) IVPB 3.375 g     3.375 g 12.5 mL/hr over 240 Minutes Intravenous 3 times per day 05/29/13 1150     05/29/13 1230  vancomycin (VANCOCIN) 1,250 mg in sodium chloride 0.9 % 250 mL IVPB  Status:  Discontinued     1,250 mg 166.7 mL/hr over 90 Minutes Intravenous Every 24 hours 05/28/13 1656 05/30/13 1031   05/28/13 2000  piperacillin-tazobactam (ZOSYN) IVPB 3.375 g  Status:  Discontinued     3.375 g 12.5 mL/hr over 240 Minutes Intravenous 3 times per day 05/28/13 1656 05/29/13 1147   05/28/13 1730  vancomycin (VANCOCIN) 500 mg in sodium chloride 0.9 % 100 mL IVPB     500  mg 100 mL/hr over 60 Minutes Intravenous NOW 05/28/13 1656 05/28/13 1830   05/28/13 1515  piperacillin-tazobactam (ZOSYN) IVPB 3.375 g     3.375 g 100 mL/hr over 30 Minutes Intravenous  Once 05/28/13 1507 05/28/13 1623   05/28/13 1515  vancomycin (VANCOCIN) IVPB 1000 mg/200 mL premix  Status:  Discontinued     1,000 mg 200 mL/hr over 60 Minutes Intravenous  Once 05/28/13 1507 05/28/13 1516   05/28/13 1100  piperacillin-tazobactam (ZOSYN) IVPB 3.375 g     3.375 g 100 mL/hr over 30 Minutes Intravenous  Once 05/28/13 1050 05/28/13 1227   05/28/13 1100  vancomycin (VANCOCIN) IVPB 1000 mg/200 mL premix     1,000 mg 200 mL/hr over 60 Minutes Intravenous  Once 05/28/13 1050 05/28/13 1335      Assessment/Plan: Acute cholecystitis s/p perc drain due to comorbidities doing well -tolerating diet, no abdominal pain -keep tube in for 6 weeks then IR to inject to determine patency of cystic duct and remove if appropriate.  Pt understands he may or may not need surgery in the future. -stable from discharge from surgical standpoint -Follow up with Dr. Georgette Dover in 3-4 weeks -surgery signing off at this time.  Please call [facility] with questions or concerns.     LOS: 4 days  [facility] ANP-BC Pager [phone]  06/01/2013 9:39 AM

## 2013-06-02 LAB — CBC
MCH: 30.9 pg (ref 26.0–34.0)
MCHC: 35.2 g/dL (ref 30.0–36.0)
MCV: 87.7 fL (ref 78.0–100.0)
Platelets: 295 10*3/uL (ref 150–400)
RBC: 3.98 MIL/uL — ABNORMAL LOW (ref 4.22–5.81)
RDW: 13.5 % (ref 11.5–15.5)
WBC: 14.7 10*3/uL — ABNORMAL HIGH (ref 4.0–10.5)

## 2013-06-02 LAB — CULTURE, BLOOD (ROUTINE X 2)

## 2013-06-02 LAB — BASIC METABOLIC PANEL
BUN: 17 mg/dL (ref 6–23)
CO2: 21 mEq/L (ref 19–32)
Calcium: 7.9 mg/dL — ABNORMAL LOW (ref 8.4–10.5)
Creatinine, Ser: 0.78 mg/dL (ref 0.50–1.35)
GFR calc non Af Amer: 77 mL/min — ABNORMAL LOW (ref >90)
Sodium: 134 mEq/L — ABNORMAL LOW (ref 135–145)

## 2013-06-02 MED ORDER — CIPROFLOXACIN HCL 500 MG PO TABS: 500.0000 mg | ORAL_TABLET | Freq: Two times a day (BID) | ORAL | Status: DC

## 2013-06-02 MED ORDER — ACETAMINOPHEN 325 MG PO TABS: 325.0000 mg | ORAL_TABLET | ORAL | Status: AC | PRN

## 2013-06-02 MED ORDER — ASPIRIN EC 325 MG PO TBEC: 325.0000 mg | DELAYED_RELEASE_TABLET | Freq: Every day | ORAL | Status: DC

## 2013-06-02 NOTE — Progress Notes (Signed)
Drew Reed to be D/C'd Home per MD order.  Discussed with the patient and all questions fully answered.    Medication List         acetaminophen 325 MG tablet  Commonly known as:  TYLENOL  Take 1 tablet (325 mg total) by mouth every 4 (four) hours as needed for pain or fever.     aspirin EC 325 MG tablet  Take 1 tablet (325 mg total) by mouth daily.     ciprofloxacin 500 MG tablet  Commonly known as:  CIPRO  Take 1 tablet (500 mg total) by mouth 2 (two) times daily.     finasteride 5 MG tablet  Commonly known as:  PROSCAR  Take 5 mg by mouth daily.     levothyroxine 25 MCG tablet  Commonly known as:  SYNTHROID, LEVOTHROID  Take 25 mcg by mouth daily before breakfast.     lisinopril 5 MG tablet  Commonly known as:  PRINIVIL,ZESTRIL  Take 2.5 mg by mouth daily.     metoprolol tartrate 25 MG tablet  Commonly known as:  LOPRESSOR  Take 25 mg by mouth 2 (two) times daily. Hold if systolic pressure is less than 130.     tamsulosin 0.4 MG Caps capsule  Commonly known as:  FLOMAX  Take 0.4 mg by mouth daily.        VVS, Skin clean, dry and intact without evidence of skin break down, no evidence of skin tears noted. IV catheter discontinued intact. Site without signs and symptoms of complications. Dressing and pressure applied. Incision to left upper chest from IJ, intact and clean. Right lower quadrant drain clean and dry, charged. Wife educated on incision and drain care. All questions answered.   An After Visit Summary was printed and given to the patient. Patient escorted via [place], and D/C home via private auto.  Driggers, Allene Pyo 06/02/2013 4:07 PM

## 2013-06-02 NOTE — Progress Notes (Signed)
Physical Therapy Treatment Patient Details Name: Drew Reed MRN: [id number] DOB: April 03, 1920 Today's Date: 06/02/2013 Time: [id number] PT Time Calculation (min): 21 min  PT Assessment / Plan / Recommendation  History of Present Illness 79 y.o. male admitted [facility] on 05/28/13 from [facility] with UTI and sepsis.  Dx with cholecystitis s/p cholecystostomy tube placement and is connected to JP drain on his right side.  Drain was placed on 05/29/13.     PT Comments   Pt is progressing well with his mobility and elected to use the RW today (which the evaluating PT recommended for balance and decreased assist level).  He was able to do stairs with very minimal assist.  He and his wife are agreeable to have a home therapist f/u at discharge.    Follow Up Recommendations  Home health PT     Does the patient have the potential to tolerate intense rehabilitation    Yes  Barriers to Discharge   None      Equipment Recommendations  None recommended by PT    Recommendations for Other Services   None  Frequency Min 3X/week   Progress towards PT Goals Progress towards PT goals: Progressing toward goals  Plan Current plan remains appropriate    Precautions / Restrictions Precautions Precautions: Fall Precaution Comments: generally unsteady on his feet.   Restrictions Weight Bearing Restrictions: No   Pertinent Vitals/Pain See vitals flow sheet. Mild increase in DOE (2/4) with mobility, no drop in O2 sats.       Mobility  Bed Mobility Bed Mobility: Supine to Sit;Sitting - Scoot to Edge of Bed Supine to Sit: 6: Modified independent (Device/Increase time);With rails;HOB elevated Sitting - Scoot to Edge of Bed: 6: Modified independent (Device/Increase time);With rail Details for Bed Mobility Assistance: pt relied on railing for support to get to EOB.   Transfers Sit to Stand: 4: Min guard;With upper extremity assist;From bed;From toilet Stand to Sit: 4: Min guard;With upper extremity assist;With  armrests;To chair/3-in-1;To toilet Details for Transfer Assistance: Min guard assist to stabilize pt for balance.  He relies heavily on upper extremity support to get to standing.   Ambulation/Gait Ambulation/Gait Assistance: 4: Min guard Ambulation Distance (Feet): 250 Feet Assistive device: Rolling walker Ambulation/Gait Assistance Details: Pt acutally suggested we use the RW to walk and was more open to using it for a short time at home at discharge.   Gait Pattern: Step-through pattern;Decreased stride length;Shuffle;Narrow base of support Gait velocity: decreased Stairs: Yes Stairs Assistance: 4: Min guard Stairs Assistance Details (indicate cue type and reason): min guard assist for safety due to weak bil legs and interesting left lean while going up and down the stairs with bil rails.   Stair Management Technique: Two rails;Alternating pattern Number of Stairs: 9      PT Goals (current goals can now be found in the care plan section) Acute Rehab PT Goals Patient Stated Goal: return home  Visit Information  Last PT Received On: 06/02/13 Assistance Needed: +1 History of Present Illness: 79 y.o. male admitted to [facility] on 05/28/13 from [place] with UTI and sepsis.  Dx with cholecystitis s/p cholecystostomy tube placement and is connected to JP drain on his right side.  Drain was placed on 05/29/13.      Subjective Data  Subjective: Pt reports it took an hour to get someone to take him to the bathroom after he called.   Patient Stated Goal: return home   Cognition  Cognition Arousal/Alertness: Awake/alert Behavior During Therapy:  WFL for tasks assessed/performed Overall Cognitive Status: Within Functional Limits for tasks assessed       End of Session PT - End of Session Equipment Utilized During Treatment: Gait belt Activity Tolerance: Patient tolerated treatment well Patient left: in chair;with call bell/phone within reach;with family/visitor present (wife in room at end of  session) Nurse Communication: Other (comment) (HHPT still recommended)    Wells Guiles B. Dezarai Prew, PT, DPT [phone]   06/02/2013, 12:29 PM

## 2013-06-02 NOTE — Discharge Summary (Signed)
Physician Discharge Summary  Drew Reed [id number] DOB: September 14, 1919 DOA: 05/28/2013  PCP: No PCP Per Patient  Admit date: 05/28/2013 Discharge date: 06/02/2013  Time spent:  Greater than 30 minutes  Recommendations for Outpatient Follow-up:  1. Drain to remain in  Place for 6 weeks, then tube cholangiogram per surgery.  Surgery/IR to determine when to d/c drain and if cholecystectomy needed 2. Patient wishes to establish local PCP in [place] 3. Consider anticoagulation for atrial fib if deconditioning improves to the point fall risk is eliminated.  Discharge Diagnoses:  Principal Problem:   Septic shock Active Problems:   Cholecystitis, acute   Bacteremia, Klebsiella   UTI (urinary tract infection)   Atrial fibrillation   Hypokalemia   Chronic systolic heart failure   Essential hypertension, benign   Discharge Condition: stabel  Filed Weights   05/28/13 1450  Weight: 89.5 kg (197 lb 5 oz)    History of present illness:   Drew Reed is a 79 y/o male transferred from [facility] for sepsis likely due to acute cholecystitis and UTI.   SIGNIFICANT EVENTS / STUDIES:  10/1- transferred to [facility] MICU  10/1 EKG with RBB and new-onset afib  10/1 Started on levophed  10/1 Percutaneous cholecystostomy placed by IR   LINES / TUBES:  L IJ 10/1>>> discontinued Foley catheter 10/1>>> discontinued Percutaneous cholecystostomy tube 10/1>>> remains in place  CULTURES:  Percutaneous cholecystostomy drainage:  Klebsiella Pneumonia Blood Cx X 2 10/1>>> Klebsiella Pneumonia Urine Culture 10/1>>> no growth  Septic shock resolved, transferred to [facility]. Initially on vancomycin and zosyn.  Vancomycin D/c'd as culture results returned Cholecystitis, acute: s/p perc drain. Leave in for 6 weeks, tube cholangeogram. F/u IR. cx show Klebsiella sensitive to most. IR an surgery consulted Bacteremia 1/4 from 10/1 Klebsiella, same strain.  UTI (urinary tract  infection): cx neg  Atrial fibrillation: New? Chads score 3. Pt very debilitated and PT concurred fall risk ASA only for now. Currently rate controlled.  May decide on anticoagulation in the future and stabilizes clinically. Echo shows EF 45-50%, biatrial dilitation, and RV dilitation Hypokalemia: repleted Chronic systolic heart failure, compensated Essential hypertension, benign  Hypothyroidism: on synthroid.   Consultants: General surgery. IR  Discharge Exam: Filed Vitals:   06/02/13 0500  BP: 131/71  Pulse: 82  Temp: 98.4 F (36.9 C)  Resp: 16    General: nontoxic.  Alert and oriented Cardiovascular: irreg irreg Respiratory: CTA without WRR Abd: drain site ok. Soft, NT, ND Ext no CCE  Discharge Instructions  Discharge Orders   Future Orders Complete By Expires   Diet general  As directed    Increase activity slowly  As directed    Walk with assistance  As directed        Medication List         acetaminophen 325 MG tablet  Commonly known as:  TYLENOL  Take 1 tablet (325 mg total) by mouth every 4 (four) hours as needed for pain or fever.     aspirin EC 325 MG tablet  Take 1 tablet (325 mg total) by mouth daily.     ciprofloxacin 500 MG tablet  Commonly known as:  CIPRO  Take 1 tablet (500 mg total) by mouth 2 (two) times daily.     finasteride 5 MG tablet  Commonly known as:  PROSCAR  Take 5 mg by mouth daily.     levothyroxine 25 MCG tablet  Commonly known as:  SYNTHROID, LEVOTHROID  Take 25 mcg  by mouth daily before breakfast.     lisinopril 5 MG tablet  Commonly known as:  PRINIVIL,ZESTRIL  Take 2.5 mg by mouth daily.     metoprolol tartrate 25 MG tablet  Commonly known as:  LOPRESSOR  Take 25 mg by mouth 2 (two) times daily. Hold if systolic pressure is less than 130.     tamsulosin 0.4 MG Caps capsule  Commonly known as:  FLOMAX  Take 0.4 mg by mouth daily.       No Known Allergies     Follow-up Information   Follow up with  TSUEI,MATTHEW K., MD. Schedule an appointment as soon as possible for a visit in 4 weeks.   Specialty:  General Surgery   Contact information:   [address] [place] [address] [phone]       Follow up with Robert Bellow, MD In 2 weeks. (Dr. Anastasio Champion is taking new patients)    Specialty:  Family Medicine   Contact information:   [address] [place] [address] [phone]        The results of significant diagnostics from this hospitalization (including imaging, microbiology, ancillary and laboratory) are listed below for reference.    Significant Diagnostic Studies: Ct Abdomen Pelvis Wo Contrast  05/28/2013   CLINICAL DATA:  Septate, weakness, fatigue, history hypertension  EXAM: CT ABDOMEN AND PELVIS WITHOUT CONTRAST  TECHNIQUE: Multidetector CT imaging of the abdomen and pelvis was performed following the standard protocol without intravenous contrast. Sagittal and coronal MPR images reconstructed from axial data set. Oral contrast not administered.  COMPARISON:  None  FINDINGS: Minimal bibasilar atelectasis.  Two right lung base nodules, 7 mm diameter image 10 and 11 mm diameter image 11.  Distended gallbladder with thickened wall and pericholecystic edema highly suspicious for acute cholecystitis.  No definite biliary tract calcifications identified.  Calcified granulomata within spleen.  Tiny calcified granuloma at upper pole right kidney image 23.  Within limits of a nonenhanced exam, no additional focal abnormalities of the liver, spleen, pancreas, kidneys, or adrenal glands otherwise identified.  Supraumbilical ventral hernia containing fat.  Right inguinal hernia containing nonobstructed segment of sigmoid colon.  Scattered mild colonic diverticulosis distally.  Atherosclerotic calcifications including coronary arteries.  Stomach and bowel loops otherwise normal appearance.  Bladder decompressed by Foley catheter with enlarged prostate  gland noted.  No mass, adenopathy, free fluid, or free air.  Bones demineralized.  IMPRESSION: Distended gallbladder with thickened gallbladder wall and pericholecystic edema highly suspicious for acute cholecystitis.  Right inguinal hernia containing a nonobstructed segment of sigmoid colon.  Supraumbilical ventral hernia containing fat.  Nonobstructing right renal calculus.  Two nonspecific nodules are seen at the right lung base, 7 mm and 11 mm in size, the metastatic disease not excluded with this appearance; recommend followup CT imaging of the chest when the patient's condition permits.   Electronically Signed   By: Lavonia Dana M.D.   On: 05/28/2013 12:43   Dg Chest Port 1 View  05/29/2013   CLINICAL DATA:  The respiratory failure.  EXAM: PORTABLE CHEST - 1 VIEW  COMPARISON:  05/28/2013  FINDINGS: Insert COPD cardiomegaly with vascular congestion. Improving aeration in the lung bases. Left central line is unchanged.  IMPRESSION: Cardiomegaly, COPD. Improving bibasilar opacities.   Electronically Signed   By: Rolm Baptise M.D.   On: 05/29/2013 06:23   Dg Chest Port 1 View  05/28/2013   CLINICAL DATA:  Fatigue, weakness  EXAM:  PORTABLE CHEST - 1 VIEW  COMPARISON:  None.  FINDINGS: Left lung base is poorly visualized. A left basilar opacity is not excluded.  No frank interstitial edema. No pneumothorax.  Cardiomegaly.  IMPRESSION: Left lung base is poorly visualized. A left basilar opacity is not excluded. Consider PA/ lateral chest radiographs further evaluation.   Electronically Signed   By: Julian Hy M.D.   On: 05/28/2013 11:20   Dg Chest Port 1v Same Day  05/28/2013   CLINICAL DATA:  Central line placement  EXAM: PORTABLE CHEST - 1 VIEW SAME DAY  COMPARISON:  Portable exam 1252 hr compared to 1107 hr.  FINDINGS: New left subclavian central venous catheter with tip projecting over SVC.  Enlargement of cardiac silhouette.  Atherosclerotic calcification aorta.  Chronic accentuation of  interstitial markings in both lungs.  Cannot exclude bibasilar infiltrates.  No gross pleural effusion or pneumothorax.  IMPRESSION: Enlargement of cardiac silhouette.  No pneumothorax following central line placement.  Bibasilar opacities cannot exclude infiltrate.   Electronically Signed   By: Lavonia Dana M.D.   On: 05/28/2013 13:20   Echo Left ventricle: The cavity size was normal. Wall thickness was increased in a pattern of moderate LVH. Systolic function was mildly reduced. The estimated ejection fraction was in the range of 45% to 50%. Wall motion was normal; there were no regional wall motion abnormalities. - Left atrium: The atrium was moderately dilated. - Right ventricle: The cavity size was mildly dilated. - Right atrium: The atrium was moderately dilated.  EKG Atrial fibrillation Right bundle branch block Left anterior fascicular block  Microbiology: Recent Results (from the past 240 hour(s))  CULTURE, BLOOD (ROUTINE X 2)     Status: None   Collection Time    05/28/13  9:57 AM      Result Value Range Status   Specimen Description BLOOD RIGHT ANTECUBITAL   Final   Special Requests BOTTLES DRAWN AEROBIC AND ANAEROBIC 10CC   Final   Culture NO GROWTH 3 DAYS   Final   Report Status PENDING   Incomplete  CULTURE, BLOOD (ROUTINE X 2)     Status: None   Collection Time    05/28/13 10:04 AM      Result Value Range Status   Specimen Description BLOOD LEFT FOREARM   Final   Special Requests BOTTLES DRAWN AEROBIC AND ANAEROBIC 8CC   Final   Culture  Setup Time     Final   Value: 05/31/2013 22:29     Performed at [facility]   Culture     Final   Value: KLEBSIELLA PNEUMONIAE     Note: PREVIOUSLY REPORTED AS GRAM NEGATIVE COCCI Performed at [facility]: [place] 06/01/13 @ 9:21AM BY RUSCOE A.     Performed at [facility]   Report Status 06/02/2013 FINAL   Final   Organism ID, Bacteria KLEBSIELLA PNEUMONIAE   Final   URINE CULTURE     Status: None   Collection Time    05/28/13 11:25 AM      Result Value Range Status   Specimen Description URINE, CATHETERIZED   Final   Special Requests NONE   Final   Culture  Setup Time     Final   Value: 05/28/2013 12:45     Performed at [facility]     Final   Value: NO GROWTH     Performed at [facility]  Final   Value: NO GROWTH     Performed at [facility]   Report Status 05/30/2013 FINAL   Final  MRSA PCR SCREENING     Status: None   Collection Time    05/28/13  3:32 PM      Result Value Range Status   MRSA by PCR NEGATIVE  NEGATIVE Final   Comment:            The GeneXpert MRSA Assay (FDA     approved for NASAL specimens     only), is one component of a     comprehensive MRSA colonization     surveillance program. It is not     intended to diagnose MRSA     infection nor to guide or     monitor treatment for     MRSA infections.  CULTURE, BLOOD (ROUTINE X 2)     Status: None   Collection Time    05/28/13  3:35 PM      Result Value Range Status   Specimen Description BLOOD LEFT ARM   Final   Special Requests BOTTLES DRAWN AEROBIC ONLY 4CC   Final   Culture  Setup Time     Final   Value: 05/28/2013 21:24     Performed at [facility]   Culture     Final   Value:        BLOOD CULTURE RECEIVED NO GROWTH TO DATE CULTURE WILL BE HELD FOR 5 DAYS BEFORE ISSUING A FINAL NEGATIVE REPORT     Performed at [facility]   Report Status PENDING   Incomplete  CULTURE, BLOOD (ROUTINE X 2)     Status: None   Collection Time    05/28/13  3:52 PM      Result Value Range Status   Specimen Description BLOOD LEFT HAND   Final   Special Requests BOTTLES DRAWN AEROBIC ONLY 10CC   Final   Culture  Setup Time     Final   Value: 05/28/2013 21:24     Performed at [facility]   Culture     Final   Value:        BLOOD CULTURE RECEIVED NO GROWTH TO DATE CULTURE WILL BE HELD FOR 5 DAYS  BEFORE ISSUING A FINAL NEGATIVE REPORT     Performed at [facility]   Report Status PENDING   Incomplete  CULTURE, ROUTINE-ABSCESS     Status: None   Collection Time    05/28/13  6:48 PM      Result Value Range Status   Specimen Description ABSCESS GALL BLADDER   Final   Special Requests NONE   Final   Gram Stain     Final   Value: ABUNDANT WBC PRESENT, PREDOMINANTLY PMN     NO SQUAMOUS EPITHELIAL CELLS SEEN     ABUNDANT GRAM NEGATIVE RODS     Performed at [facility]   Culture     Final   Value: ABUNDANT KLEBSIELLA PNEUMONIAE     Performed at [facility]   Report Status 05/31/2013 FINAL   Final   Organism ID, Bacteria KLEBSIELLA PNEUMONIAE   Final     Labs: Basic Metabolic Panel:  Recent Labs Lab 05/28/13 0958 05/28/13 1535 05/29/13 0500 05/30/13 0300 05/31/13 0416 06/01/13 0611 06/02/13 0445  NA 138 137 141 136 137 136 134*  K 3.2* 3.3* 3.2* 3.6 3.2* 3.6 3.6  CL 104 106 111 107 106 103 102  CO2 25 16* 18* 19 19 21 21   GLUCOSE 106* 121* 124* 112* 89 96 157*  BUN 34* 33* 32* 35* 24* 18 17  CREATININE 1.89* 1.46* 1.13 0.95 0.86 0.92 0.78  CALCIUM 8.8 7.7* 7.8* 8.2* 7.9* 8.2* 7.9*  MG  --  1.4* 1.5  --  1.7  --   --   PHOS  --  2.8 3.5  --   --   --   --    Liver Function Tests:  Recent Labs Lab 05/28/13 0958 05/28/13 1535  AST 65* 63*  ALT 36 42  ALKPHOS 84 78  BILITOT 2.6* 2.1*  PROT 5.9* 5.6*  ALBUMIN 2.9* 2.7*   No results found for this basename: LIPASE, AMYLASE,  in the last 168 hours No results found for this basename: AMMONIA,  in the last 168 hours CBC:  Recent Labs Lab 05/28/13 0958 05/28/13 1535 05/29/13 0500 05/30/13 0850 06/01/13 0611 06/02/13 0445  WBC 16.3* 27.1* 16.9* 14.0* 16.4* 14.7*  NEUTROABS 14.9*  --   --   --   --   --   HGB 13.6 12.5* 12.5* 12.0* 13.9 12.3*  HCT 39.3 35.8* 35.9* 35.1* 40.4 34.9*  MCV 91.6 90.2 89.8 89.5 89.2 87.7  PLT 214 222 232 213 304 295   Cardiac Enzymes:  Recent  Labs Lab 05/28/13 0958 05/28/13 1552 05/28/13 2105 05/29/13 0305  TROPONINI <0.30 <0.30 <0.30 <0.30   BNP: BNP (last 3 results)  Recent Labs  05/28/13 1552  PROBNP 5066.0*   CBG:  Recent Labs Lab 05/28/13 1542 05/28/13 1918  GLUCAP 115* 119*    Signed:  Shoshana Johal L  [facility] 06/02/2013, 1:46 PM

## 2013-06-03 LAB — CULTURE, BLOOD (ROUTINE X 2): Culture: NO GROWTH

## 2013-06-04 ENCOUNTER — Ambulatory Visit (INDEPENDENT_AMBULATORY_CARE_PROVIDER_SITE_OTHER): Payer: Medicare Other | Admitting: General Surgery

## 2013-06-04 ENCOUNTER — Encounter (INDEPENDENT_AMBULATORY_CARE_PROVIDER_SITE_OTHER): Payer: Self-pay | Admitting: General Surgery

## 2013-06-04 ENCOUNTER — Telehealth (INDEPENDENT_AMBULATORY_CARE_PROVIDER_SITE_OTHER): Payer: Self-pay

## 2013-06-04 VITALS — BP 124/76 | HR 76 | Temp 98.2°F | Resp 18 | Ht 75.0 in | Wt 200.0 lb

## 2013-06-04 DIAGNOSIS — K810 Acute cholecystitis: Secondary | ICD-10-CM

## 2013-06-04 LAB — CULTURE, BLOOD (ROUTINE X 2)

## 2013-06-04 NOTE — Progress Notes (Signed)
Subjective:     Patient ID: Drew Reed, male   DOB: Aug 23, 1920, 79 y.o.   MRN: [id number]  HPI The patient is a 79 year old white male who was recently admitted to the hospital with cholecystitis. Because of his comorbidities he was treated with a percutaneous cholecystostomy tube. He has been at home for 2 or 3 days and his family says that he has poor oral intake. He only complains that food doesn't taste good so he doesn't eat. He also has soreness at the drain entry site but denies abdominal pain.  Review of Systems  Constitutional: Positive for appetite change.  HENT: Negative.   Eyes: Negative.   Respiratory: Negative.   Cardiovascular: Negative.   Gastrointestinal: Positive for nausea.  Endocrine: Negative.   Genitourinary: Negative.   Musculoskeletal: Negative.   Skin: Negative.   Allergic/Immunologic: Negative.   Neurological: Negative.   Hematological: Negative.   Psychiatric/Behavioral: Negative.        Objective:   Physical Exam  Constitutional: He is oriented to person, place, and time. He appears well-developed and well-nourished.  HENT:  Head: Normocephalic and atraumatic.  Eyes: Conjunctivae and EOM are normal. Pupils are equal, round, and reactive to light.  Neck: Normal range of motion. Neck supple.  Cardiovascular: Normal rate, regular rhythm and normal heart sounds.   Pulmonary/Chest: Effort normal and breath sounds normal.  Abdominal: Soft. Bowel sounds are normal.  There is a reducible hernia just above the umbilicus. The drain tube is draining bile appropriately.  Musculoskeletal: Normal range of motion.  Neurological: He is alert and oriented to person, place, and time.  Skin: Skin is warm and dry.  Psychiatric: He has a normal mood and affect. His behavior is normal.       Assessment:     The patient is having poor by mouth intake at home but does not appear sick today. It is difficult to evaluate his hydration status sitting in the clinic. I  have encouraged him to increase his fluid intake with water and gatorade . If his family feels as though he is not maintaining his intake than I have counseled him to take him to the emergency department to be admitted. From a surgical standpoint his drain is working well     Plan:     The patient will try to increase his oral intake of foods and liquids. He will followup with Dr. Georgette Dover in the next 3 weeks. His family is to take him to the hospital if they feel like he is not failing to thrive

## 2013-06-04 NOTE — Telephone Encounter (Signed)
The pt's wife called to report the pt came home Monday and he can't eat.  He has been vomiting since last night.  Their thermometer doesn't work so she is unsure if he has fever.  He has no pain.  His bowels are moving and he is urinating.  She states his abdomen is distended.  They are waiting on the home health nurse to arrive.  His wife is concerned for dehydration.  He has a gallbladder drain for cholecystitis that has brown output.    I paged Dr Georgette Dover

## 2013-06-04 NOTE — Patient Instructions (Signed)
Drink plenty of water and gatorade. If he can't stay hydrated then he needs to go to ER and get admitted

## 2013-06-04 NOTE — Telephone Encounter (Signed)
Spoke to Dr. Georgette Dover who stated to have patient either be seen in urgent office or in the ED.  Called and spoke to wife who states she would prefer urgent office rather than waiting in the ED.  Appt made at this time.

## 2013-06-10 ENCOUNTER — Telehealth (INDEPENDENT_AMBULATORY_CARE_PROVIDER_SITE_OTHER): Payer: Self-pay | Admitting: General Surgery

## 2013-06-10 ENCOUNTER — Encounter (INDEPENDENT_AMBULATORY_CARE_PROVIDER_SITE_OTHER): Payer: Self-pay | Admitting: Surgery

## 2013-06-10 ENCOUNTER — Ambulatory Visit (INDEPENDENT_AMBULATORY_CARE_PROVIDER_SITE_OTHER): Payer: Medicare Other | Admitting: Surgery

## 2013-06-10 VITALS — BP 128/80 | HR 74 | Temp 98.0°F | Resp 16 | Ht 75.0 in | Wt 192.2 lb

## 2013-06-10 DIAGNOSIS — K810 Acute cholecystitis: Secondary | ICD-10-CM

## 2013-06-10 DIAGNOSIS — Z434 Encounter for attention to other artificial openings of digestive tract: Secondary | ICD-10-CM

## 2013-06-10 NOTE — Progress Notes (Signed)
Status post percutaneous cholecystostomy placement on 05/28/13 for acute cholecystitis in the setting of multiple medical problems including new onset atrial fibrillation. He is improving with his appetite and hydration. He is actually looking forward to his meals and is keeping himself very well hydrated. He is having clear bilious output from his drain. There was some concern about the drain site by his home health nurse so he came into the office today for evaluation.  Filed Vitals:   06/10/13 1359  BP: 128/80  Pulse: 74  Temp: 98 F (36.7 C)  Resp: 16   His cholecystostomy tube is patent with clear bile in the bulb. The suture at the drain site is pulling the skin slightly which has caused some mild irritation but there is certainly no sign of infection. I encouraged him not to allow too much traction on the drain itself. It appears that the patient is improving significantly from his condition in the hospital. We will recheck him in 3 weeks. At that time we will likely order a cholangiogram through his cholecystostomy. We will consider pulling it after that time.  His mental hernia remains soft and reducible.  Imogene Burn. Georgette Dover, MD, [facility] Surgery  General/ Trauma Surgery  06/10/2013 2:18 PM

## 2013-06-10 NOTE — Telephone Encounter (Signed)
Mandy the [facility] nurse called in explaining that this patient has not been doing his drain care and that she only comes out once a week per orders.  She explained that the redness around the perc drain incision has spread a significant amount from last week.  Patient denies fevers, N/V, or drainage from the incision.  Explained to her that Dr. Georgette Dover has some openings in his schedule today and that we will fit in this patient for a 1:50 appt.

## 2013-06-11 ENCOUNTER — Telehealth (INDEPENDENT_AMBULATORY_CARE_PROVIDER_SITE_OTHER): Payer: Self-pay | Admitting: *Deleted

## 2013-06-11 NOTE — Telephone Encounter (Signed)
Faxed documentation received from [facility] that "physical therapy states patient does not need or want OT @ this time."  Per Sherren Mocha

## 2013-07-01 ENCOUNTER — Ambulatory Visit (INDEPENDENT_AMBULATORY_CARE_PROVIDER_SITE_OTHER): Payer: Medicare Other | Admitting: Surgery

## 2013-07-01 ENCOUNTER — Encounter (INDEPENDENT_AMBULATORY_CARE_PROVIDER_SITE_OTHER): Payer: Self-pay | Admitting: Surgery

## 2013-07-01 VITALS — BP 126/78 | HR 78 | Temp 97.6°F | Resp 14 | Ht 75.0 in | Wt 190.6 lb

## 2013-07-01 DIAGNOSIS — Z434 Encounter for attention to other artificial openings of digestive tract: Secondary | ICD-10-CM

## 2013-07-01 DIAGNOSIS — K810 Acute cholecystitis: Secondary | ICD-10-CM

## 2013-07-01 NOTE — Progress Notes (Signed)
The patient returns for evaluation of his cholecystostomy tube. He is doing quite well. He has regained his appetite and is eating and having regular bowel movements. He does have a little bit of itching around his tube but otherwise is doing quite well. His drain is putting out clear greenish bile.  Filed Vitals:   07/01/13 1505  BP: 126/78  Pulse: 78  Temp: 97.6 F (36.4 C)  Resp: 14   His abdomen is soft and nontender. The drain site looks good.  We will obtain a cholangiogram through his cholecystostomy tube. If the cystic duct is patent then we will remove the cholecystostomy tube. It is possible that the patient will not need to have cholecystectomy considering his advanced age and medical comorbidities. We will recheck him in 3-4 weeks.  Drew Reed. Georgette Dover, MD, [facility] Surgery  General/ Trauma Surgery  07/01/2013 3:26 PM

## 2013-07-10 ENCOUNTER — Ambulatory Visit (HOSPITAL_COMMUNITY)
Admission: RE | Admit: 2013-07-10 | Discharge: 2013-07-10 | Disposition: A | Discharge status: Home / Self Care | Payer: Medicare Other | Source: Ambulatory Visit | Attending: Surgery | Admitting: Surgery

## 2013-07-10 DIAGNOSIS — K810 Acute cholecystitis: Secondary | ICD-10-CM | POA: Insufficient documentation

## 2013-07-10 MED ORDER — IOHEXOL 300 MG/ML  SOLN
50.0000 mL | Freq: Once | INTRAMUSCULAR | Status: AC | PRN
  Administered 2013-07-10: 7.5 mL

## 2013-07-10 MED ORDER — IOHEXOL 300 MG/ML  SOLN: 50.0000 mL | Freq: Once | INTRAMUSCULAR | Status: AC | PRN

## 2013-07-10 NOTE — Procedures (Signed)
Cholecystomy tube check demonstrates patency of the cystic and CBD.  No evidence of cholelithiasis or choledocholithiasis.   Cholecystomy tube capped for a trial of internalization. Patient given gravity bag to reconnect the chole tube if he develops recurrent right upper quadrant abd pain.

## 2013-07-28 ENCOUNTER — Encounter (INDEPENDENT_AMBULATORY_CARE_PROVIDER_SITE_OTHER): Payer: Self-pay | Admitting: Surgery

## 2013-07-28 ENCOUNTER — Ambulatory Visit (INDEPENDENT_AMBULATORY_CARE_PROVIDER_SITE_OTHER): Payer: Medicare Other | Admitting: Surgery

## 2013-07-28 VITALS — BP 136/80 | HR 78 | Temp 97.4°F | Resp 16 | Ht 75.0 in | Wt 196.4 lb

## 2013-07-28 DIAGNOSIS — Z434 Encounter for attention to other artificial openings of digestive tract: Secondary | ICD-10-CM

## 2013-07-28 NOTE — Progress Notes (Signed)
The patient is seen status post cholangiogram via cholecystostomy tube.  He is doing very well. No abdominal pain. Appetite and bowel movements are normal.  Dg Cholangiogram Post Op  07/10/2013   CLINICAL DATA:  History of acute cholecystitis, post cholecystostomy tube placement on 05/28/2013. Please perform cholecystostomy tube check to evaluate patency of the cystic duct.  EXAM: CATHETER CHOLANGIOGRAM  TECHNIQUE: Multiple spot fluoroscopic and radiographic images were obtained of the cholecystostomy tube following the injection of a small amount of contrast.  CONTRAST:  7.5 mL OMNIPAQUE IOHEXOL 300 MG/ML  SOLN  COMPARISON:  CT abdomen and pelvis -05/28/2013; ultrasound fluoroscopic guided cholecystostomy tube placement -05/28/2013  FLUOROSCOPY TIME:  20 seconds  FINDINGS: Preprocedural spot fluoroscopic image demonstrates grossly unchanged positioning of the existing cholecystostomy tube overlying the expected location of the gallbladder fossa.  Multiple spot fluoroscopic and radiographic images were obtained following the injection of a small amount of contrast via the existing cholecystostomy tube. There is brisk opacification and passage of contrast through the cystic duct. There is brisk opacification and passage of contrast through the common bile duct which appears nondilated. There is passage of contrast through the common bile duct to the level of the descending duodenum. There is a minimal amount of contrast that has refluxed into the central aspect of the intrahepatic biliary system which appears nondilated. There is no definitive opacification of the pancreatic duct.  There are no discrete filling defects within the opacified portions of the gallbladder, cystic duct or biliary tree this suggests the presence of choledocholithiasis. Of note, the patient did not experience any discomfort with injection of the cholecystostomy tube.  The cholecystostomy tube was flushed with a small amount of saline and  capped. A dressing was placed. The patient tolerated the procedure well without immediate postprocedural complication.  IMPRESSION: 1. Appropriately positioned and functioning cholecystostomy tube. No exchange performed. 2. The cystic and common bile ducts are widely patent to the level of the descending duodenum. No evidence of cholelithiasis/choledocholithiasis.  PLAN: Given the the wide patency of the cystic and common bile ducts as well as the absence of cholelithiasis/choledocholithiasis, the cholecystostomy tube was capped for a trial of internalization. (Note, the patient was given a new gravity bag if he were to experience recurrent right upper quadrant pain.) The patient should follow-up with Dr. Georgette Dover and if he remains asymptomatic with internalization of the cholecystostomy, removal of the tube may be considered.  The patient and the patient's wife demonstrated understanding and are in agreement with the above proposed plan of care.   Electronically Signed   By: Sandi Mariscal M.D.   On: 07/10/2013 11:28    His abdomen is soft and nontender. The cholecystostomy tube was removed without difficulty. A dry dressing was applied. He should change the bandage over the drain site until completely healed. Followup as needed. His umbilical hernia and right inguinal hernia was easily reducible and do not seem to be causing any symptoms.  Imogene Burn. Georgette Dover, MD, [facility] Surgery  General/ Trauma Surgery  07/28/2013 11:01 AM

## 2015-01-14 DIAGNOSIS — C23 Malignant neoplasm of gallbladder: Secondary | ICD-10-CM | POA: Diagnosis not present

## 2015-03-03 ENCOUNTER — Ambulatory Visit (HOSPITAL_COMMUNITY): Payer: Non-veteran care | Admitting: Hematology & Oncology

## 2018-08-15 ENCOUNTER — Inpatient Hospital Stay (HOSPITAL_COMMUNITY)
Admission: EM | Admit: 2018-08-15 | Discharge: 2018-08-16 | DRG: 603 | Disposition: A | Discharge status: Home / Self Care | Payer: No Typology Code available for payment source | Attending: Internal Medicine | Admitting: Internal Medicine

## 2018-08-15 ENCOUNTER — Other Ambulatory Visit: Payer: Self-pay

## 2018-08-15 ENCOUNTER — Encounter (HOSPITAL_COMMUNITY): Payer: Self-pay

## 2018-08-15 DIAGNOSIS — S61431A Puncture wound without foreign body of right hand, initial encounter: Secondary | ICD-10-CM | POA: Diagnosis not present

## 2018-08-15 DIAGNOSIS — I5022 Chronic systolic (congestive) heart failure: Secondary | ICD-10-CM | POA: Diagnosis not present

## 2018-08-15 DIAGNOSIS — N179 Acute kidney failure, unspecified: Secondary | ICD-10-CM | POA: Diagnosis not present

## 2018-08-15 DIAGNOSIS — D72829 Elevated white blood cell count, unspecified: Secondary | ICD-10-CM | POA: Diagnosis present

## 2018-08-15 DIAGNOSIS — I110 Hypertensive heart disease with heart failure: Secondary | ICD-10-CM | POA: Diagnosis present

## 2018-08-15 DIAGNOSIS — I959 Hypotension, unspecified: Secondary | ICD-10-CM | POA: Diagnosis not present

## 2018-08-15 DIAGNOSIS — I4891 Unspecified atrial fibrillation: Secondary | ICD-10-CM | POA: Diagnosis not present

## 2018-08-15 DIAGNOSIS — Z79899 Other long term (current) drug therapy: Secondary | ICD-10-CM | POA: Diagnosis not present

## 2018-08-15 DIAGNOSIS — I10 Essential (primary) hypertension: Secondary | ICD-10-CM | POA: Diagnosis not present

## 2018-08-15 DIAGNOSIS — Z7982 Long term (current) use of aspirin: Secondary | ICD-10-CM

## 2018-08-15 DIAGNOSIS — E039 Hypothyroidism, unspecified: Secondary | ICD-10-CM | POA: Diagnosis present

## 2018-08-15 DIAGNOSIS — E860 Dehydration: Secondary | ICD-10-CM | POA: Diagnosis not present

## 2018-08-15 DIAGNOSIS — Z23 Encounter for immunization: Secondary | ICD-10-CM

## 2018-08-15 DIAGNOSIS — N400 Enlarged prostate without lower urinary tract symptoms: Secondary | ICD-10-CM | POA: Diagnosis present

## 2018-08-15 DIAGNOSIS — L03113 Cellulitis of right upper limb: Principal | ICD-10-CM | POA: Diagnosis present

## 2018-08-15 DIAGNOSIS — W540XXA Bitten by dog, initial encounter: Secondary | ICD-10-CM

## 2018-08-15 HISTORY — DX: Renal tubulo-interstitial disease, unspecified: N15.9

## 2018-08-15 HISTORY — DX: Chronic systolic (congestive) heart failure: I50.22

## 2018-08-15 LAB — CBC WITH DIFFERENTIAL/PLATELET
Abs Immature Granulocytes: 0.14 10*3/uL — ABNORMAL HIGH (ref 0.00–0.07)
Basophils Absolute: 0 10*3/uL (ref 0.0–0.1)
Basophils Relative: 0 %
Eosinophils Absolute: 0 10*3/uL (ref 0.0–0.5)
Eosinophils Relative: 0 %
HCT: 35.6 % — ABNORMAL LOW (ref 39.0–52.0)
Hemoglobin: 11.4 g/dL — ABNORMAL LOW (ref 13.0–17.0)
IMMATURE GRANULOCYTES: 1 %
Lymphocytes Relative: 4 %
Lymphs Abs: 0.6 10*3/uL — ABNORMAL LOW (ref 0.7–4.0)
MCH: 28.4 pg (ref 26.0–34.0)
MCHC: 32 g/dL (ref 30.0–36.0)
MCV: 88.6 fL (ref 80.0–100.0)
MONOS PCT: 9 %
Monocytes Absolute: 1.4 10*3/uL — ABNORMAL HIGH (ref 0.1–1.0)
NEUTROS PCT: 86 %
Neutro Abs: 13.6 10*3/uL — ABNORMAL HIGH (ref 1.7–7.7)
Platelets: 221 10*3/uL (ref 150–400)
RBC: 4.02 MIL/uL — ABNORMAL LOW (ref 4.22–5.81)
RDW: 14.8 % (ref 11.5–15.5)
WBC: 15.6 10*3/uL — ABNORMAL HIGH (ref 4.0–10.5)
nRBC: 0 % (ref 0.0–0.2)

## 2018-08-15 LAB — URINALYSIS, ROUTINE W REFLEX MICROSCOPIC
Bilirubin Urine: NEGATIVE
Glucose, UA: NEGATIVE mg/dL
Hgb urine dipstick: NEGATIVE
Ketones, ur: NEGATIVE mg/dL
Nitrite: NEGATIVE
Protein, ur: NEGATIVE mg/dL
Specific Gravity, Urine: 1.023 (ref 1.005–1.030)
pH: 5 (ref 5.0–8.0)

## 2018-08-15 LAB — BASIC METABOLIC PANEL
ANION GAP: 8 (ref 5–15)
BUN: 34 mg/dL — ABNORMAL HIGH (ref 8–23)
CO2: 22 mmol/L (ref 22–32)
Calcium: 8.7 mg/dL — ABNORMAL LOW (ref 8.9–10.3)
Chloride: 105 mmol/L (ref 98–111)
Creatinine, Ser: 1.36 mg/dL — ABNORMAL HIGH (ref 0.61–1.24)
GFR calc Af Amer: 51 mL/min — ABNORMAL LOW (ref >60)
GFR, EST NON AFRICAN AMERICAN: 44 mL/min — AB (ref >60)
Glucose, Bld: 132 mg/dL — ABNORMAL HIGH (ref 70–99)
Potassium: 3.8 mmol/L (ref 3.5–5.1)
Sodium: 135 mmol/L (ref 135–145)

## 2018-08-15 LAB — MAGNESIUM: Magnesium: 1.6 mg/dL — ABNORMAL LOW (ref 1.7–2.4)

## 2018-08-15 LAB — SODIUM, URINE, RANDOM: Sodium, Ur: 63 mmol/L

## 2018-08-15 LAB — CREATININE, URINE, RANDOM: Creatinine, Urine: 254.69 mg/dL

## 2018-08-15 MED ORDER — METOPROLOL TARTRATE 25 MG PO TABS
25.0000 mg | ORAL_TABLET | Freq: Two times a day (BID) | ORAL | Status: DC
  Administered 2018-08-15: 25 mg via ORAL
  Filled 2018-08-15 (×2): qty 1

## 2018-08-15 MED ORDER — ENOXAPARIN SODIUM 40 MG/0.4ML ~~LOC~~ SOLN
40.0000 mg | SUBCUTANEOUS | Status: DC
  Administered 2018-08-16: 40 mg via SUBCUTANEOUS
  Filled 2018-08-15: qty 0.4

## 2018-08-15 MED ORDER — LACTATED RINGERS IV SOLN
INTRAVENOUS | Status: AC
  Administered 2018-08-15: 18:00:00 via INTRAVENOUS

## 2018-08-15 MED ORDER — VANCOMYCIN HCL 10 G IV SOLR
2000.0000 mg | Freq: Once | INTRAVENOUS | Status: AC
  Administered 2018-08-15: 2000 mg via INTRAVENOUS
  Filled 2018-08-15: qty 2000

## 2018-08-15 MED ORDER — VANCOMYCIN HCL IN DEXTROSE 1-5 GM/200ML-% IV SOLN: 1000.0000 mg | INTRAVENOUS | Status: DC

## 2018-08-15 MED ORDER — SODIUM CHLORIDE 0.9 % IV BOLUS
1000.0000 mL | Freq: Once | INTRAVENOUS | Status: AC
  Administered 2018-08-15: 1000 mL via INTRAVENOUS

## 2018-08-15 MED ORDER — TETANUS-DIPHTH-ACELL PERTUSSIS 5-2.5-18.5 LF-MCG/0.5 IM SUSP
0.5000 mL | Freq: Once | INTRAMUSCULAR | Status: AC
  Administered 2018-08-15: 0.5 mL via INTRAMUSCULAR
  Filled 2018-08-15: qty 0.5

## 2018-08-15 MED ORDER — CLINDAMYCIN PHOSPHATE 600 MG/50ML IV SOLN
600.0000 mg | Freq: Three times a day (TID) | INTRAVENOUS | Status: DC
  Administered 2018-08-15 – 2018-08-16 (×3): 600 mg via INTRAVENOUS
  Filled 2018-08-15 (×3): qty 50

## 2018-08-15 MED ORDER — LEVOTHYROXINE SODIUM 25 MCG PO TABS
25.0000 ug | ORAL_TABLET | Freq: Every day | ORAL | Status: DC
  Administered 2018-08-16: 25 ug via ORAL
  Filled 2018-08-15: qty 1

## 2018-08-15 MED ORDER — ASPIRIN EC 325 MG PO TBEC
325.0000 mg | DELAYED_RELEASE_TABLET | Freq: Every day | ORAL | Status: DC
  Administered 2018-08-16: 325 mg via ORAL
  Filled 2018-08-15: qty 1

## 2018-08-15 MED ORDER — ACETAMINOPHEN 650 MG RE SUPP: 650.0000 mg | Freq: Four times a day (QID) | RECTAL | Status: DC | PRN

## 2018-08-15 MED ORDER — ACETAMINOPHEN 325 MG PO TABS: 650.0000 mg | ORAL_TABLET | Freq: Four times a day (QID) | ORAL | Status: DC | PRN

## 2018-08-15 MED ORDER — FINASTERIDE 5 MG PO TABS
5.0000 mg | ORAL_TABLET | Freq: Every day | ORAL | Status: DC
  Administered 2018-08-16: 5 mg via ORAL
  Filled 2018-08-15 (×4): qty 1

## 2018-08-15 NOTE — Progress Notes (Signed)
Pharmacy Antibiotic Note  Drew Reed is a 84 y.o. male admitted on 08/15/2018 with cellulitis.  Pharmacy has been consulted for Vancomycin dosing.  Plan: Vancomycin 2000 mg IV x 1 dose. Vancomycin 1000 mg IV every 24 hours.  Goal trough 10-15 mcg/mL.  Monitor labs, c/s, and vanco trough as indicated.  Height: 6\' 5"  (195.6 cm) Weight: 185 lb (83.9 kg) IBW/kg (Calculated) : 89.1  Temp (24hrs), Avg:98 F (36.7 C), Min:98 F (36.7 C), Max:98 F (36.7 C)  Recent Labs  Lab 08/15/18 1021  WBC 15.6*  CREATININE 1.36*    Estimated Creatinine Clearance: 37.7 mL/min (A) (by C-G formula based on SCr of 1.36 mg/dL (H)).    No Known Allergies  Antimicrobials this admission: Vanco 12/19 >>      Dose adjustments this admission: [place]  Microbiology results: 12/19 BCx: pending   Thank you for allowing pharmacy to be a part of this patient's care.  Ramond Craver 08/15/2018 12:31 PM

## 2018-08-15 NOTE — H&P (Signed)
History and Physical    PLEASE NOTE THAT DRAGON DICTATION SOFTWARE WAS USED IN THE CONSTRUCTION OF THIS NOTE.   Drew Reed CBS:[id number] DOB: 04/23/1920 DOA: 08/15/2018  PCP: [facility] Patient coming from: Home  I have personally briefly reviewed patient's old medical records in [place]  Chief Complaint: Right upper extremity pain  HPI: Drew Reed is a 84 y.o. male with medical history significant for chronic systolic heart failure with most recent echocardiogram in October 2014 showing LVEF 45 to 50%, hypertension, who is admitted to [facility] on 08/15/2018 with cellulitis of the right upper extremity and acute kidney injury after presenting from home to the [facility] emergency department complaining of right upper extremity pain.  The patient reports that he was bit by his house dog on the dorsal aspect of his right hand 1 week ago as she was attempting to move the dog's food.  Subsequently, over the last 2 to 3 days, the patient notes development of progressive erythema, tenderness, increased warmth, swelling associated with the dorsal aspect of the right hand and extending into the posterior aspect of the right forearm.  Denies any associated discharge.  He also denies any associated subjective fever, chills, rigors, nausea, vomiting, or diarrhea.  Denies any additional rash.  He reports that his dog is up-to-date on its vaccinations, including its rabies vaccine.  He denies any chest pain, shortness of breath, cough, or dysuria.  He denies any change in sensation as it relates to the right hand, including no paresthesias or numbness.  He also notes no diminished strength involving the year right upper extremity following the dog bite, and reports that he has been on no recent antibiotics.   ED Course: Vital signs in the emergency department were notable for the following: T-max 98.6, heart rate 58-79; initial blood pressure 94/55, which  improved to 114/56 following initiation of IV fluid; respiratory rate 14-19, and oxygen saturation 94 to 98% on room air.  Labs performed in the ED were notable for the following: BMP notable for creatinine 1.36 relative to most recent prior creatinine of 0.8, albeit this was in October 2014.  CBC notable for white blood cell count of 15,600 with 86% neutrophils.  Blood cultures x2 were collected prior to initiation of any antibiotics.   While still in the ED, the patient received a Tdap vaccine, a 1 L IV normal saline bolus, and vancomycin 2 g IV x1.  Subsequently, the patient was admitted to the [place] floor for further evaluation and management of presenting right upper extremity cellulitis and AKI.    Review of Systems: As per HPI otherwise 10 point review of systems negative.   Past Medical History:  Diagnosis Date  . Chronic systolic heart failure ([place])   . Hypertension   . Kidney infection    2010  . Thyroid disease     Past Surgical History:  Procedure Laterality Date  . CHOLECYSTECTOMY      Social History:  reports that he has never smoked. He does not have any smokeless tobacco history on file. He reports that he does not drink alcohol or use drugs.   No Known Allergies   Family History: History reviewed. No pertinent family history.   Prior to Admission medications   Medication Sig Start Date End Date Taking? Authorizing Provider  acetaminophen (TYLENOL) 325 MG tablet Take 1 tablet (325 mg total) by mouth every 4 (four) hours as needed for pain or fever.  06/02/13   Delfina Redwood, MD  aspirin EC 325 MG tablet Take 1 tablet (325 mg total) by mouth daily. 06/02/13   Delfina Redwood, MD  finasteride (PROSCAR) 5 MG tablet Take 5 mg by mouth daily.    [provider]  levothyroxine (SYNTHROID, LEVOTHROID) 25 MCG tablet Take 25 mcg by mouth daily before breakfast.    [provider]  lisinopril (PRINIVIL,ZESTRIL) 5 MG tablet Take 2.5 mg by mouth  daily.    [provider]  metoprolol tartrate (LOPRESSOR) 25 MG tablet Take 25 mg by mouth 2 (two) times daily. Hold if systolic pressure is less than 130.    [provider]  tamsulosin (FLOMAX) 0.4 MG CAPS capsule Take 0.4 mg by mouth daily.    [provider]     Objective    Physical Exam: Vitals:   08/15/18 1430 08/15/18 1500 08/15/18 1530 08/15/18 1600  BP: 104/62 122/69 119/61 126/78  Pulse: 68 68 70 63  Resp: (!) 23 18 (!) 23 14  Temp:      TempSrc:      SpO2: 91% 95% 93% 99%  Weight:      Height:        General: appears to be stated age; alert, oriented Skin: two small puncture wounds noted on the dorsal aspect of the right hand just distal to the base of the second digit; additionally erythema, increased warmth, tenderness, and mild swelling noted to be associated with the dorsal aspect of the right hand extending slightly into the proximal aspect of digit #2 and radiating distal to the right wrist about the posterior aspect of the right upper extremity.  No associated discharge noted. Head:  AT/[place] Eyes:  PEARL b/l, EOMI Mouth:  Oral mucosa membranes appear dry, normal dentition Neck: supple; trachea midline Heart:  RRR; did not appreciate any M/R/G Lungs: CTAB, did not appreciate any wheezes, rales, or rhonchi Abdomen: + BS; soft, ND, NT Extremities: Right upper extremity erythema, tenderness, increased warmth, and mild swelling, as further noted above.   Labs on Admission: I have personally reviewed following labs and imaging studies  CBC: Recent Labs  Lab 08/15/18 1021  WBC 15.6*  NEUTROABS 13.6*  HGB 11.4*  HCT 35.6*  MCV 88.6  PLT 742   Basic Metabolic Panel: Recent Labs  Lab 08/15/18 1021  NA 135  K 3.8  CL 105  CO2 22  GLUCOSE 132*  BUN 34*  CREATININE 1.36*  CALCIUM 8.7*   GFR: Estimated Creatinine Clearance: 37.7 mL/min (A) (by C-G formula based on SCr of 1.36 mg/dL (H)). Liver Function Tests: No results for  input(s): AST, ALT, ALKPHOS, BILITOT, PROT, ALBUMIN in the last 168 hours. No results for input(s): LIPASE, AMYLASE in the last 168 hours. No results for input(s): AMMONIA in the last 168 hours. Coagulation Profile: No results for input(s): INR, PROTIME in the last 168 hours. Cardiac Enzymes: No results for input(s): CKTOTAL, CKMB, CKMBINDEX, TROPONINI in the last 168 hours. BNP (last 3 results) No results for input(s): PROBNP in the last 8760 hours. HbA1C: No results for input(s): HGBA1C in the last 72 hours. CBG: No results for input(s): GLUCAP in the last 168 hours. Lipid Profile: No results for input(s): CHOL, HDL, LDLCALC, TRIG, CHOLHDL, LDLDIRECT in the last 72 hours. Thyroid Function Tests: No results for input(s): TSH, T4TOTAL, FREET4, T3FREE, THYROIDAB in the last 72 hours. Anemia Panel: No results for input(s): VITAMINB12, FOLATE, FERRITIN, TIBC, IRON, RETICCTPCT in the last 72 hours.  Urine analysis:    Component Value Date/Time   COLORURINE YELLOW 05/28/2013 1125   APPEARANCEUR CLEAR 05/28/2013 1125   LABSPEC >1.030 (H) 05/28/2013 1125   PHURINE 5.5 05/28/2013 1125   GLUCOSEU NEGATIVE 05/28/2013 1125   HGBUR MODERATE (A) 05/28/2013 1125   BILIRUBINUR NEGATIVE 05/28/2013 1125   KETONESUR NEGATIVE 05/28/2013 1125   PROTEINUR TRACE (A) 05/28/2013 1125   UROBILINOGEN 0.2 05/28/2013 1125   NITRITE NEGATIVE 05/28/2013 1125   LEUKOCYTESUR TRACE (A) 05/28/2013 1125    Radiological Exams on Admission: No results found.    Microbiology: -Blood cultures x2 collected in the ED on 08/15/2018 prior to initiation of antibiotics: Results pending.   Assessment/Plan   Drew Reed is a 84 y.o. male with medical history significant for chronic systolic heart failure with most recent echocardiogram in October 2014 showing LVEF 45 to 50%, hypertension, who is admitted to [facility] on 08/15/2018 with cellulitis of the right upper extremity and acute kidney injury  after presenting from home to the [facility] emergency department complaining of right upper extremity pain.   Principal Problem:   Cellulitis of right upper extremity Active Problems:   Chronic systolic heart failure (HCC)   Essential hypertension, benign   Leukocytosis   AKI (acute kidney injury) ([place])   Hypothyroidism   #) Right upper extremity cellulitis: Diagnosis on the basis of erythema, tenderness, increased warmth, and mild swelling associated with the dorsal aspect of the right hand and radiating into the posterior aspect of the right forearm, with portal of entry suspected to be 2 small puncture wounds on the dorsal aspect of patient's right hand incurred via dog bite from patient's house dog, which is reportedly vaccinated against rabies.  Of note, right hand exhibits no sensorimotor deficits, and no evidence of tenosynovitis at this time.  Overall, there does not appear to be an indication for involvement of a hand surgeon at present, but will closely monitor patient's subsequent clinical course.  In terms of antibiotic selection, I have chosen clindamycin which will provide coverage against anaerobes, MRSA coverage, and also offered antitoxin benefit.  While the patient does present with leukocytosis, sirs criteria are otherwise not met and therefore patient's presentation does not meet criteria to be considered sepsis at this time.  He was initially mildly hypotensive, however this is resolved with just 1 L of IV fluids, suggestive of a strong contributory element of dehydration, which the patient confirms and stating that he has had diminished intake of water over the course of the last several days.  Tdap vaccine administered in the ED today.  Plan: IV clindamycin, as further described above.  Will monitor for results of blood cultures x2 collected in the ED today prior to initiation of antibiotics.  Repeat CBC with differential in the morning.  I have placed nursing  communication orders requesting that right upper extremity be elevated as well as the distribution of current erythema associated the right upper extremity be outlined.    #) Acute kidney injury: Presenting labs reflect creatinine of 1.36, relative to most recent prior creatinine data point of 0.8, however, it is acknowledged that this most recent prior creatinine value occurred in October 2014.  Given patient's report of diminished intake of water over the last several days, I suspect that presentation is consistent with acute kidney injury on the basis of prerenal process stemming from dehydration.  Plan: Gentle IV fluids, with monitoring for volume overload in the context of patient's concomitant history of  chronic systolic heart failure.  Monitor strict I's and O's.  Attempt to avoid nephrotoxic agents.  Will hold home lisinopril for now.  Check urinalysis, including for presence of urinary cast.  Will check random urine sodium as well as random urine creatinine.  Repeat BMP in the morning.    #) Acquired hypothyroidism: On Synthroid as an outpatient.  I have not encountered a prior TSH value in our EMR.  Plan: Continue home Synthroid.  Also check TSH level with morning labs.    #) Chronic systolic heart failure: Most recent echocardiogram, which occurred in October 2014 showed moderate LVH, EF 45 to 50%, no evidence of focal wall motion abnormalities, moderately dilated bilateral atria, and no evidence of valvular pathology.  No evidence of acutely decompensated heart failure at presentation.  Outpatient cardiac regimen includes Lopressor as well as lisinopril.  Plan: For now I am holding home Lopressor in the context of concomitant acute kidney injury.  Continue home Lopressor.  Monitor strict I's and O's.     #) Benign prostatic hyperplasia: On Flomax as well as finasteride as an outpatient.  Plan: In the setting of presenting mild hypotension, will hold home Flomax for now.  Continue  home finasteride.    DVT prophylaxis: Lovenox 40 mg subcu daily Code Status: Full code (confirmed per my discussions today with the patient as well as his wife). Family Communication: Discussed patient's case with his wife, who is present at bedside. Disposition Plan:  Per Rounding Team Consults called: (none)  Admission status: inpatient/med-surg.    PLEASE NOTE THAT DRAGON DICTATION SOFTWARE WAS USED IN THE CONSTRUCTION OF THIS NOTE.   [place] [facility] Pager [phone] From 3PM- 11PM.   Otherwise, please contact night-coverage  [email or web address] Password [email or web address]  08/15/2018, 4:14 PM

## 2018-08-15 NOTE — ED Triage Notes (Signed)
Pt was sent by [facility] due to low BP. Pt went to to [place] to have dog bite evaluated from one week ago. Right hand red , swollen and sore. BP initially 60/33 and then on recheck 95/55. Pt was bit by dog approx one week ago

## 2018-08-15 NOTE — ED Notes (Signed)
[phone] or [phone], spouse

## 2018-08-15 NOTE — ED Provider Notes (Signed)
[facility] EMERGENCY DEPARTMENT Provider Note   CSN: [id number] Arrival date & time: 08/15/18  1000     History   Chief Complaint Chief Complaint  Patient presents with  . Hypotension  . Hand Pain    HPI Drew Reed is a 84 y.o. male.  Redness and swelling in right hand after a dog bite approximately 1 week ago.  He was seen by the [facility] center today and sent to the emergency department.  His blood pressure at that time was low.  He lives independently with his wife.  Severity of symptoms is moderate to severe.  Palpation makes symptoms worse.     Past Medical History:  Diagnosis Date  . Hypertension   . Kidney infection    2010  . Thyroid disease     Patient Active Problem List   Diagnosis Date Noted  . Cholecystostomy care ([place]) 06/10/2013  . Atrial fibrillation ([place]) 05/31/2013  . Bacteremia 05/31/2013  . Hypokalemia 05/31/2013  . Chronic systolic heart failure ([place]) 05/31/2013  . Essential hypertension, benign 05/31/2013  . Septic shock ([place]) 05/28/2013  . UTI (urinary tract infection) 05/28/2013  . Cholecystitis, acute 05/28/2013  . Chronic kidney disease (CKD), stage IV (severe) ([place]) 05/28/2013    Past Surgical History:  Procedure Laterality Date  . CHOLECYSTECTOMY          Home Medications    Prior to Admission medications   Medication Sig Start Date End Date Taking? Authorizing Provider  acetaminophen (TYLENOL) 325 MG tablet Take 1 tablet (325 mg total) by mouth every 4 (four) hours as needed for pain or fever. 06/02/13   Delfina Redwood, MD  aspirin EC 325 MG tablet Take 1 tablet (325 mg total) by mouth daily. 06/02/13   Delfina Redwood, MD  finasteride (PROSCAR) 5 MG tablet Take 5 mg by mouth daily.    [provider]  levothyroxine (SYNTHROID, LEVOTHROID) 25 MCG tablet Take 25 mcg by mouth daily before breakfast.    [provider]  lisinopril (PRINIVIL,ZESTRIL) 5 MG tablet Take 2.5 mg by mouth daily.    [provider]  metoprolol tartrate (LOPRESSOR) 25 MG tablet Take 25 mg by mouth 2 (two) times daily. Hold if systolic pressure is less than 130.    [provider]  tamsulosin (FLOMAX) 0.4 MG CAPS capsule Take 0.4 mg by mouth daily.    [provider]    Family History No family history on file.  Social History Social History   Tobacco Use  . Smoking status: Never Smoker  Substance Use Topics  . Alcohol use: No  . Drug use: No     Allergies   Patient has no known allergies.   Review of Systems Review of Systems  All other systems reviewed and are negative.    Physical Exam Updated Vital Signs BP 122/69   Pulse 68   Temp 98 F (36.7 C) (Oral)   Resp 18   Ht 6\' 5"  (1.956 m)   Wt 83.9 kg   SpO2 95%   BMI 21.94 kg/m   Physical Exam Vitals signs and nursing note reviewed.  Constitutional:      Appearance: He is well-developed.     Comments: Pressure slightly low, no acute distress  HENT:     Head: Normocephalic and atraumatic.  Eyes:     Conjunctiva/sclera: Conjunctivae normal.  Neck:     Musculoskeletal: Neck supple.  Cardiovascular:     Rate and Rhythm: Normal rate and  regular rhythm.  Pulmonary:     Effort: Pulmonary effort is normal.     Breath sounds: Normal breath sounds.  Abdominal:     General: Bowel sounds are normal.     Palpations: Abdomen is soft.  Musculoskeletal: Normal range of motion.  Skin:    Comments: Right hand: Erythematous and edematous.  There is a puncture mark at the dorsum of the hand between the second and third digits at the MCP joint.  No obvious tenosynovitis.  Neurological:     Mental Status: He is alert and oriented to person, place, and time.  Psychiatric:        Behavior: Behavior normal.      ED Treatments / Results  Labs (all labs ordered are listed, but only abnormal results are displayed) Labs Reviewed  CBC WITH DIFFERENTIAL/PLATELET - Abnormal; Notable for the following components:       Result Value   WBC 15.6 (*)    RBC 4.02 (*)    Hemoglobin 11.4 (*)    HCT 35.6 (*)    Neutro Abs 13.6 (*)    Lymphs Abs 0.6 (*)    Monocytes Absolute 1.4 (*)    Abs Immature Granulocytes 0.14 (*)    All other components within normal limits  BASIC METABOLIC PANEL - Abnormal; Notable for the following components:   Glucose, Bld 132 (*)    BUN 34 (*)    Creatinine, Ser 1.36 (*)    Calcium 8.7 (*)    GFR calc non Af Amer 44 (*)    GFR calc Af Amer 51 (*)    All other components within normal limits  CULTURE, BLOOD (ROUTINE X 2)  CULTURE, BLOOD (ROUTINE X 2)    EKG None  Radiology No results found.  Procedures Procedures (including critical care time)  Medications Ordered in ED Medications  vancomycin (VANCOCIN) IVPB 1000 mg/200 mL premix (has no administration in time range)  sodium chloride 0.9 % bolus 1,000 mL (0 mLs Intravenous Stopped 08/15/18 1205)  Tdap (BOOSTRIX) injection 0.5 mL (0.5 mLs Intramuscular Given 08/15/18 1101)  vancomycin (VANCOCIN) 2,000 mg in sodium chloride 0.9 % 500 mL IVPB (0 mg Intravenous Stopped 08/15/18 1332)     Initial Impression / Assessment and Plan / ED Course  I have reviewed the triage vital signs and the nursing notes.  Pertinent labs & imaging results that were available during my care of the patient were reviewed by me and considered in my medical decision making (see chart for details).     Patient presents with a dog bite to his right hand and obvious cellulitis.  Will initiate IV antibiotics and admit to general medicine.  Final Clinical Impressions(s) / ED Diagnoses   Final diagnoses:  Cellulitis of right hand    ED Discharge Orders    None       Nat Christen, MD 08/15/18 1554

## 2018-08-16 ENCOUNTER — Encounter (HOSPITAL_COMMUNITY): Payer: Self-pay | Admitting: Internal Medicine

## 2018-08-16 DIAGNOSIS — N179 Acute kidney failure, unspecified: Secondary | ICD-10-CM | POA: Diagnosis present

## 2018-08-16 DIAGNOSIS — E039 Hypothyroidism, unspecified: Secondary | ICD-10-CM | POA: Diagnosis present

## 2018-08-16 DIAGNOSIS — D72829 Elevated white blood cell count, unspecified: Secondary | ICD-10-CM | POA: Diagnosis present

## 2018-08-16 DIAGNOSIS — I5022 Chronic systolic (congestive) heart failure: Secondary | ICD-10-CM

## 2018-08-16 DIAGNOSIS — I10 Essential (primary) hypertension: Secondary | ICD-10-CM

## 2018-08-16 DIAGNOSIS — L03113 Cellulitis of right upper limb: Principal | ICD-10-CM

## 2018-08-16 LAB — CBC WITH DIFFERENTIAL/PLATELET
ABS IMMATURE GRANULOCYTES: 0.05 10*3/uL (ref 0.00–0.07)
Basophils Absolute: 0 10*3/uL (ref 0.0–0.1)
Basophils Relative: 0 %
Eosinophils Absolute: 0.1 10*3/uL (ref 0.0–0.5)
Eosinophils Relative: 1 %
HCT: 30.7 % — ABNORMAL LOW (ref 39.0–52.0)
Hemoglobin: 9.8 g/dL — ABNORMAL LOW (ref 13.0–17.0)
Immature Granulocytes: 1 %
Lymphocytes Relative: 9 %
Lymphs Abs: 0.7 10*3/uL (ref 0.7–4.0)
MCH: 28.5 pg (ref 26.0–34.0)
MCHC: 31.9 g/dL (ref 30.0–36.0)
MCV: 89.2 fL (ref 80.0–100.0)
Monocytes Absolute: 1 10*3/uL (ref 0.1–1.0)
Monocytes Relative: 12 %
Neutro Abs: 6.1 10*3/uL (ref 1.7–7.7)
Neutrophils Relative %: 77 %
Platelets: 202 10*3/uL (ref 150–400)
RBC: 3.44 MIL/uL — ABNORMAL LOW (ref 4.22–5.81)
RDW: 14.6 % (ref 11.5–15.5)
WBC: 8 10*3/uL (ref 4.0–10.5)
nRBC: 0 % (ref 0.0–0.2)

## 2018-08-16 LAB — TSH: TSH: 2.818 u[IU]/mL (ref 0.350–4.500)

## 2018-08-16 LAB — BASIC METABOLIC PANEL
Anion gap: 5 (ref 5–15)
BUN: 30 mg/dL — ABNORMAL HIGH (ref 8–23)
CO2: 22 mmol/L (ref 22–32)
Calcium: 8.1 mg/dL — ABNORMAL LOW (ref 8.9–10.3)
Chloride: 109 mmol/L (ref 98–111)
Creatinine, Ser: 0.92 mg/dL (ref 0.61–1.24)
GFR calc Af Amer: >60 mL/min (ref >60)
GFR calc non Af Amer: >60 mL/min (ref >60)
Glucose, Bld: 93 mg/dL (ref 70–99)
Potassium: 3.7 mmol/L (ref 3.5–5.1)
Sodium: 136 mmol/L (ref 135–145)

## 2018-08-16 LAB — MAGNESIUM: Magnesium: 1.6 mg/dL — ABNORMAL LOW (ref 1.7–2.4)

## 2018-08-16 MED ORDER — AMOXICILLIN-POT CLAVULANATE 875-125 MG PO TABS: 1.0000 | ORAL_TABLET | Freq: Two times a day (BID) | ORAL | 0 refills | Status: AC

## 2018-08-16 MED ORDER — MAGNESIUM OXIDE 400 MG PO TABS: 400.0000 mg | ORAL_TABLET | Freq: Every day | ORAL | 0 refills | Status: AC

## 2018-08-16 MED ORDER — MAGNESIUM SULFATE 2 GM/50ML IV SOLN: 2.0000 g | Freq: Once | INTRAVENOUS | Status: DC

## 2018-08-16 NOTE — Progress Notes (Signed)
Patient has not urinated since 1900 on 12/19. States he does not feel like he needs to use the bathroom and currently refusing to try or to allow bladder scan. Patient states he will increase his fluid intake over next hour as he has been sleeping all night and will get up to use the bathroom after that.

## 2018-08-16 NOTE — Progress Notes (Signed)
Discharge instructions reviewed with patient and wife at bedside. Given AVS, prescriptions sent to his pharmacy by MD. Verbalized understanding of instructions, need to pick up prescriptions and when to follow-up with PCP. Verbalized signs and symptoms of worsening cellulitis and when to seek medical attention. IV site came out prior to discharge this afternoon, site within normal limits. Pt left floor in stable condition via w/c accompanied by nurse tech. Donavan Foil, RN

## 2018-08-16 NOTE — Progress Notes (Signed)
Patient ambulated in hallway with standby assist and gait belt. Tolerated well with no complaints. Stated "I want to go home". Notified Dr. Roderic Palau. Also notified him IV access has been lost, IV noted to be out with ambulation. Unable to administer mag sulfate with loss of IV access, stated no need for new IV access for that prior to discharge. Donavan Foil, RN

## 2018-08-16 NOTE — Discharge Summary (Signed)
Physician Discharge Summary  IVEY CINA OEH:[id number] DOB: March 20, 1920 DOA: 08/15/2018  PCP: [facility]  Admit date: 08/15/2018 Discharge date: 08/16/2018  Admitted From: Home Disposition: Home  Recommendations for Outpatient Follow-up:  1. Follow up with PCP in 1-2 weeks 2. Please obtain BMP/CBC in one week 3. Lisinopril and metoprolol have been discontinued due to borderline blood pressures  Discharge Condition: Stable CODE STATUS: Full code Diet recommendation: Heart healthy  Brief/Interim Summary: 84 year old male with a history of chronic systolic heart failure, ejection fraction of 45 to 50%, admitted to the hospital with right hand cellulitis after a dog bite injury.  Patient was admitted to the hospital and started on intravenous clindamycin.  He was also noted to be clinically dehydrated and had mildly elevated creatinine.  He was started on IV fluid hydration with improvement of renal function.  His overall cellulitis has dramatically improved.  Swelling and erythema has significantly improved.  He will be transitioned to oral Augmentin.  Blood pressures have been marginal, on the lower side.  He is chronically on lisinopril and metoprolol which were held in the hospital.  Heart rate was ranging in the 50s.  Will discontinue lisinopril and metoprolol.  This can be resumed as an outpatient as felt appropriate.  The patient ambulated without any complaints of dizziness.  He feels ready for discharge home.  Discharge Diagnoses:  Principal Problem:   Cellulitis of right upper extremity Active Problems:   Chronic systolic heart failure (HCC)   Essential hypertension, benign   Leukocytosis   AKI (acute kidney injury) ([place])   Hypothyroidism    Discharge Instructions  Discharge Instructions    Diet - low sodium heart healthy   Complete by:  As directed    Increase activity slowly   Complete by:  As directed      Allergies as of 08/16/2018   No Known  Allergies     Medication List    STOP taking these medications   lisinopril 2.5 MG tablet Commonly known as:  PRINIVIL,ZESTRIL   metoprolol tartrate 50 MG tablet Commonly known as:  LOPRESSOR     TAKE these medications   acetaminophen 325 MG tablet Commonly known as:  TYLENOL Take 1 tablet (325 mg total) by mouth every 4 (four) hours as needed for pain or fever.   amoxicillin-clavulanate 875-125 MG tablet Commonly known as:  AUGMENTIN Take 1 tablet by mouth 2 (two) times daily for 7 days.   aspirin EC 81 MG tablet Take 81 mg by mouth daily.   carboxymethylcellulose 0.5 % Soln Commonly known as:  REFRESH PLUS Apply 1 drop to eye 3 (three) times daily as needed.   cetirizine 10 MG tablet Commonly known as:  ZYRTEC Take 10 mg by mouth daily as needed for allergies.   cholecalciferol 25 MCG (1000 UT) tablet Commonly known as:  VITAMIN D3 Take 1,000 Units by mouth daily.   docusate sodium 100 MG capsule Commonly known as:  COLACE Take 100 mg by mouth 2 (two) times daily.   finasteride 5 MG tablet Commonly known as:  PROSCAR Take 5 mg by mouth daily.   guaifenesin 400 MG Tabs tablet Commonly known as:  HUMIBID E Take 400 mg by mouth daily as needed (for congestion).   levothyroxine 25 MCG tablet Commonly known as:  SYNTHROID, LEVOTHROID Take 25 mcg by mouth daily before breakfast.   magnesium oxide 400 MG tablet Commonly known as:  MAG-OX Take 1 tablet (400 mg total) by mouth daily.  tamsulosin 0.4 MG Caps capsule Commonly known as:  FLOMAX Take 0.4 mg by mouth daily.      Follow-up Information    [facility], [facility]. Schedule an appointment as soon as possible for a visit in 2 week(s).   Contact information: [place] [address] [phone]          No Known Allergies  Consultations:     Procedures/Studies:  No results found.    Subjective: He is able to close his right hand into a fist, feels  that swelling, pain and erythema has improved  Discharge Exam: Vitals:   08/15/18 2200 08/16/18 0515 08/16/18 0636 08/16/18 1358  BP:  (!) 89/51 101/66 112/68  Pulse: 68 (!) 52 (!) 56 (!) 57  Resp:  18  16  Temp:  98.1 F (36.7 C)  98.8 F (37.1 C)  TempSrc:  Oral  Oral  SpO2:  (!) 76%  96%  Weight:  82.6 kg    Height:        General: Pt is alert, awake, not in acute distress Cardiovascular: RRR, S1/S2 +, no rubs, no gallops Respiratory: CTA bilaterally, no wheezing, no rhonchi Abdominal: Soft, NT, ND, bowel sounds + Extremities: Erythema and swelling over right hand has significantly improved    The results of significant diagnostics from this hospitalization (including imaging, microbiology, ancillary and laboratory) are listed below for reference.     Microbiology: Recent Results (from the past 240 hour(s))  Blood culture (routine x 2)     Status: None (Preliminary result)   Collection Time: 08/15/18 10:45 AM  Result Value Ref Range Status   Specimen Description BLOOD LEFT HAND  Final   Special Requests   Final    BOTTLES DRAWN AEROBIC AND ANAEROBIC Blood Culture adequate volume   Culture   Final    NO GROWTH < 24 HOURS Performed at [facility], [address], [place], [place] [address]    Report Status PENDING  Incomplete  Blood culture (routine x 2)     Status: None (Preliminary result)   Collection Time: 08/15/18 10:53 AM  Result Value Ref Range Status   Specimen Description BLOOD RIGHT ANTECUBITAL  Final   Special Requests   Final    BOTTLES DRAWN AEROBIC AND ANAEROBIC Blood Culture results may not be optimal due to an excessive volume of blood received in culture bottles   Culture   Final    NO GROWTH < 24 HOURS Performed at [facility], [address] [place], [place] [address]    Report Status PENDING  Incomplete     Labs: BNP (last 3 results) No results for input(s): BNP in the last 8760 hours. Basic Metabolic Panel: Recent Labs  Lab  08/15/18 1021 08/16/18 0644  NA 135 136  K 3.8 3.7  CL 105 109  CO2 22 22  GLUCOSE 132* 93  BUN 34* 30*  CREATININE 1.36* 0.92  CALCIUM 8.7* 8.1*  MG 1.6* 1.6*   Liver Function Tests: No results for input(s): AST, ALT, ALKPHOS, BILITOT, PROT, ALBUMIN in the last 168 hours. No results for input(s): LIPASE, AMYLASE in the last 168 hours. No results for input(s): AMMONIA in the last 168 hours. CBC: Recent Labs  Lab 08/15/18 1021 08/16/18 0644  WBC 15.6* 8.0  NEUTROABS 13.6* 6.1  HGB 11.4* 9.8*  HCT 35.6* 30.7*  MCV 88.6 89.2  PLT 221 202   Cardiac Enzymes: No results for input(s): CKTOTAL, CKMB, CKMBINDEX, TROPONINI in the last 168  hours. BNP: Invalid input(s): POCBNP CBG: No results for input(s): GLUCAP in the last 168 hours. D-Dimer No results for input(s): DDIMER in the last 72 hours. Hgb A1c No results for input(s): HGBA1C in the last 72 hours. Lipid Profile No results for input(s): CHOL, HDL, LDLCALC, TRIG, CHOLHDL, LDLDIRECT in the last 72 hours. Thyroid function studies Recent Labs    08/16/18 0644  TSH 2.818   Anemia work up No results for input(s): VITAMINB12, FOLATE, FERRITIN, TIBC, IRON, RETICCTPCT in the last 72 hours. Urinalysis    Component Value Date/Time   COLORURINE AMBER (A) 08/15/2018 1830   APPEARANCEUR HAZY (A) 08/15/2018 1830   LABSPEC 1.023 08/15/2018 1830   PHURINE 5.0 08/15/2018 1830   GLUCOSEU NEGATIVE 08/15/2018 1830   HGBUR NEGATIVE 08/15/2018 1830   BILIRUBINUR NEGATIVE 08/15/2018 1830   KETONESUR NEGATIVE 08/15/2018 1830   PROTEINUR NEGATIVE 08/15/2018 1830   UROBILINOGEN 0.2 05/28/2013 1125   NITRITE NEGATIVE 08/15/2018 1830   LEUKOCYTESUR TRACE (A) 08/15/2018 1830   Sepsis Labs Invalid input(s): PROCALCITONIN,  WBC,  LACTICIDVEN Microbiology Recent Results (from the past 240 hour(s))  Blood culture (routine x 2)     Status: None (Preliminary result)   Collection Time: 08/15/18 10:45 AM  Result Value Ref Range Status    Specimen Description BLOOD LEFT HAND  Final   Special Requests   Final    BOTTLES DRAWN AEROBIC AND ANAEROBIC Blood Culture adequate volume   Culture   Final    NO GROWTH < 24 HOURS Performed at [facility], [address] [place], [place] [address]    Report Status PENDING  Incomplete  Blood culture (routine x 2)     Status: None (Preliminary result)   Collection Time: 08/15/18 10:53 AM  Result Value Ref Range Status   Specimen Description BLOOD RIGHT ANTECUBITAL  Final   Special Requests   Final    BOTTLES DRAWN AEROBIC AND ANAEROBIC Blood Culture results may not be optimal due to an excessive volume of blood received in culture bottles   Culture   Final    NO GROWTH < 24 HOURS Performed at [facility], [address] [place], [place] [address]    Report Status PENDING  Incomplete     Time coordinating discharge: 46mins  SIGNED:   Kathie Dike, MD  [facility] 08/16/2018, 8:44 PM Pager   If 7PM-7AM, please contact night-coverage [email or web address] Password [email or web address]

## 2018-08-16 NOTE — Discharge Instructions (Signed)

## 2018-08-16 NOTE — Progress Notes (Signed)
Patient IVF order expired early this morning per night shift RN. Notified Dr. Roderic Palau, states will address on rounds. Patient reportedly did not void during the night. Assisted to bathroom this morning and patient voided clear, yellow urine to external catheter upon standing after breakfast. Urine output 550 ml. Donavan Foil, RN

## 2018-08-20 LAB — CULTURE, BLOOD (ROUTINE X 2)
CULTURE: NO GROWTH
Culture: NO GROWTH
Special Requests: ADEQUATE

## 2019-03-27 ENCOUNTER — Other Ambulatory Visit: Payer: Self-pay

## 2019-05-08 DIAGNOSIS — C44319 Basal cell carcinoma of skin of other parts of face: Secondary | ICD-10-CM | POA: Diagnosis not present

## 2019-05-08 DIAGNOSIS — L570 Actinic keratosis: Secondary | ICD-10-CM | POA: Diagnosis not present

## 2019-05-08 DIAGNOSIS — L821 Other seborrheic keratosis: Secondary | ICD-10-CM | POA: Diagnosis not present

## 2019-05-08 DIAGNOSIS — D485 Neoplasm of uncertain behavior of skin: Secondary | ICD-10-CM | POA: Diagnosis not present

## 2019-05-26 DIAGNOSIS — C44319 Basal cell carcinoma of skin of other parts of face: Secondary | ICD-10-CM | POA: Diagnosis not present

## 2019-05-26 DIAGNOSIS — Z85828 Personal history of other malignant neoplasm of skin: Secondary | ICD-10-CM | POA: Diagnosis not present

## 2020-06-10 ENCOUNTER — Emergency Department (HOSPITAL_COMMUNITY): Payer: No Typology Code available for payment source

## 2020-06-10 ENCOUNTER — Other Ambulatory Visit: Payer: Self-pay

## 2020-06-10 ENCOUNTER — Observation Stay (HOSPITAL_COMMUNITY): Payer: No Typology Code available for payment source

## 2020-06-10 ENCOUNTER — Inpatient Hospital Stay (HOSPITAL_COMMUNITY)
Admission: EM | Admit: 2020-06-10 | Discharge: 2020-06-12 | DRG: 811 | Disposition: A | Discharge status: Home Health | Payer: No Typology Code available for payment source | Attending: Internal Medicine | Admitting: Internal Medicine

## 2020-06-10 DIAGNOSIS — R0902 Hypoxemia: Secondary | ICD-10-CM | POA: Diagnosis present

## 2020-06-10 DIAGNOSIS — I6529 Occlusion and stenosis of unspecified carotid artery: Secondary | ICD-10-CM | POA: Diagnosis not present

## 2020-06-10 DIAGNOSIS — Z20822 Contact with and (suspected) exposure to covid-19: Secondary | ICD-10-CM | POA: Diagnosis present

## 2020-06-10 DIAGNOSIS — J984 Other disorders of lung: Secondary | ICD-10-CM | POA: Diagnosis not present

## 2020-06-10 DIAGNOSIS — Z9049 Acquired absence of other specified parts of digestive tract: Secondary | ICD-10-CM

## 2020-06-10 DIAGNOSIS — Y92009 Unspecified place in unspecified non-institutional (private) residence as the place of occurrence of the external cause: Secondary | ICD-10-CM

## 2020-06-10 DIAGNOSIS — M47816 Spondylosis without myelopathy or radiculopathy, lumbar region: Secondary | ICD-10-CM | POA: Diagnosis not present

## 2020-06-10 DIAGNOSIS — M47812 Spondylosis without myelopathy or radiculopathy, cervical region: Secondary | ICD-10-CM | POA: Diagnosis not present

## 2020-06-10 DIAGNOSIS — R296 Repeated falls: Secondary | ICD-10-CM | POA: Diagnosis not present

## 2020-06-10 DIAGNOSIS — M25552 Pain in left hip: Secondary | ICD-10-CM | POA: Diagnosis not present

## 2020-06-10 DIAGNOSIS — N400 Enlarged prostate without lower urinary tract symptoms: Secondary | ICD-10-CM | POA: Diagnosis present

## 2020-06-10 DIAGNOSIS — R079 Chest pain, unspecified: Secondary | ICD-10-CM | POA: Diagnosis not present

## 2020-06-10 DIAGNOSIS — E039 Hypothyroidism, unspecified: Secondary | ICD-10-CM | POA: Diagnosis not present

## 2020-06-10 DIAGNOSIS — H748X1 Other specified disorders of right middle ear and mastoid: Secondary | ICD-10-CM | POA: Diagnosis not present

## 2020-06-10 DIAGNOSIS — I5043 Acute on chronic combined systolic (congestive) and diastolic (congestive) heart failure: Secondary | ICD-10-CM | POA: Diagnosis present

## 2020-06-10 DIAGNOSIS — I708 Atherosclerosis of other arteries: Secondary | ICD-10-CM | POA: Diagnosis not present

## 2020-06-10 DIAGNOSIS — I700 Atherosclerosis of aorta: Secondary | ICD-10-CM | POA: Diagnosis not present

## 2020-06-10 DIAGNOSIS — I5023 Acute on chronic systolic (congestive) heart failure: Secondary | ICD-10-CM | POA: Diagnosis not present

## 2020-06-10 DIAGNOSIS — J90 Pleural effusion, not elsewhere classified: Secondary | ICD-10-CM | POA: Diagnosis not present

## 2020-06-10 DIAGNOSIS — R42 Dizziness and giddiness: Secondary | ICD-10-CM | POA: Diagnosis not present

## 2020-06-10 DIAGNOSIS — R195 Other fecal abnormalities: Secondary | ICD-10-CM | POA: Diagnosis present

## 2020-06-10 DIAGNOSIS — D500 Iron deficiency anemia secondary to blood loss (chronic): Secondary | ICD-10-CM | POA: Diagnosis not present

## 2020-06-10 DIAGNOSIS — R52 Pain, unspecified: Secondary | ICD-10-CM | POA: Diagnosis not present

## 2020-06-10 DIAGNOSIS — R5381 Other malaise: Secondary | ICD-10-CM | POA: Diagnosis not present

## 2020-06-10 DIAGNOSIS — Z66 Do not resuscitate: Secondary | ICD-10-CM | POA: Diagnosis present

## 2020-06-10 DIAGNOSIS — M25512 Pain in left shoulder: Secondary | ICD-10-CM | POA: Diagnosis not present

## 2020-06-10 DIAGNOSIS — M4186 Other forms of scoliosis, lumbar region: Secondary | ICD-10-CM | POA: Diagnosis not present

## 2020-06-10 DIAGNOSIS — W19XXXA Unspecified fall, initial encounter: Secondary | ICD-10-CM | POA: Diagnosis not present

## 2020-06-10 DIAGNOSIS — D649 Anemia, unspecified: Secondary | ICD-10-CM | POA: Diagnosis not present

## 2020-06-10 DIAGNOSIS — I110 Hypertensive heart disease with heart failure: Secondary | ICD-10-CM | POA: Diagnosis present

## 2020-06-10 DIAGNOSIS — I6523 Occlusion and stenosis of bilateral carotid arteries: Secondary | ICD-10-CM | POA: Diagnosis not present

## 2020-06-10 DIAGNOSIS — S2242XA Multiple fractures of ribs, left side, initial encounter for closed fracture: Secondary | ICD-10-CM | POA: Diagnosis present

## 2020-06-10 DIAGNOSIS — I739 Peripheral vascular disease, unspecified: Secondary | ICD-10-CM | POA: Diagnosis not present

## 2020-06-10 LAB — BRAIN NATRIURETIC PEPTIDE
B Natriuretic Peptide: 687 pg/mL — ABNORMAL HIGH (ref 0.0–100.0)
B Natriuretic Peptide: 648 pg/mL — ABNORMAL HIGH (ref 0.0–100.0)

## 2020-06-10 LAB — FERRITIN: Ferritin: 14 ng/mL — ABNORMAL LOW (ref 24–336)

## 2020-06-10 LAB — VITAMIN B12: Vitamin B-12: 288 pg/mL (ref 180–914)

## 2020-06-10 LAB — RESPIRATORY PANEL BY RT PCR (FLU A&B, COVID)
Influenza A by PCR: NEGATIVE
Influenza B by PCR: NEGATIVE
SARS Coronavirus 2 by RT PCR: NEGATIVE

## 2020-06-10 LAB — CBC WITH DIFFERENTIAL/PLATELET
Basophils Absolute: 0.1 10*3/uL (ref 0.0–0.1)
Basophils Relative: 0 %
Eosinophils Absolute: 0.1 10*3/uL (ref 0.0–0.5)
Eosinophils Relative: 1 %
HCT: 23.5 % — ABNORMAL LOW (ref 39.0–52.0)
Hemoglobin: 7 g/dL — ABNORMAL LOW (ref 13.0–17.0)
Lymphocytes Relative: 5 %
Lymphs Abs: 0.4 10*3/uL — ABNORMAL LOW (ref 0.7–4.0)
MCH: 19.9 pg — ABNORMAL LOW (ref 26.0–34.0)
MCHC: 29.8 g/dL — ABNORMAL LOW (ref 30.0–36.0)
MCV: 66.8 fL — ABNORMAL LOW (ref 80.0–100.0)
Monocytes Absolute: 0.9 10*3/uL (ref 0.1–1.0)
Monocytes Relative: 9 %
Neutro Abs: 7.7 10*3/uL (ref 1.7–7.7)
Neutrophils Relative %: 84 %
Platelets: 285 10*3/uL (ref 150–400)
RBC: 3.52 MIL/uL — ABNORMAL LOW (ref 4.22–5.81)
RDW: 19.4 % — ABNORMAL HIGH (ref 11.5–15.5)
WBC: 9.1 10*3/uL (ref 4.0–10.5)
nRBC: 0 % (ref 0.0–0.2)

## 2020-06-10 LAB — COMPREHENSIVE METABOLIC PANEL
ALT: 13 U/L (ref 0–44)
AST: 15 U/L (ref 15–41)
Albumin: 3.4 g/dL — ABNORMAL LOW (ref 3.5–5.0)
Alkaline Phosphatase: 50 U/L (ref 38–126)
Anion gap: 9 (ref 5–15)
BUN: 22 mg/dL (ref 8–23)
CO2: 23 mmol/L (ref 22–32)
Calcium: 8.5 mg/dL — ABNORMAL LOW (ref 8.9–10.3)
Chloride: 103 mmol/L (ref 98–111)
Creatinine, Ser: 1.03 mg/dL (ref 0.61–1.24)
GFR, Estimated: >60 mL/min (ref >60)
Glucose, Bld: 104 mg/dL — ABNORMAL HIGH (ref 70–99)
Potassium: 4.2 mmol/L (ref 3.5–5.1)
Sodium: 135 mmol/L (ref 135–145)
Total Bilirubin: 1.8 mg/dL — ABNORMAL HIGH (ref 0.3–1.2)
Total Protein: 6.1 g/dL — ABNORMAL LOW (ref 6.5–8.1)

## 2020-06-10 LAB — IRON AND TIBC
Iron: 14 ug/dL — ABNORMAL LOW (ref 45–182)
Saturation Ratios: 3 % — ABNORMAL LOW (ref 17.9–39.5)
TIBC: 412 ug/dL (ref 250–450)
UIBC: 398 ug/dL

## 2020-06-10 LAB — POC OCCULT BLOOD, ED: Fecal Occult Bld: POSITIVE — AB

## 2020-06-10 LAB — TROPONIN I (HIGH SENSITIVITY)
Troponin I (High Sensitivity): 62 ng/L — ABNORMAL HIGH (ref <18)
Troponin I (High Sensitivity): 67 ng/L — ABNORMAL HIGH (ref <18)

## 2020-06-10 LAB — RETICULOCYTES
Immature Retic Fract: 26.6 % — ABNORMAL HIGH (ref 2.3–15.9)
RBC.: 3.73 MIL/uL — ABNORMAL LOW (ref 4.22–5.81)
Retic Count, Absolute: 42.5 10*3/uL (ref 19.0–186.0)
Retic Ct Pct: 1.1 % (ref 0.4–3.1)

## 2020-06-10 LAB — PREPARE RBC (CROSSMATCH)

## 2020-06-10 LAB — FOLATE: Folate: 8.3 ng/mL (ref >5.9)

## 2020-06-10 MED ORDER — ACETAMINOPHEN 650 MG RE SUPP: 650.0000 mg | Freq: Four times a day (QID) | RECTAL | Status: DC | PRN

## 2020-06-10 MED ORDER — FUROSEMIDE 10 MG/ML IJ SOLN
40.0000 mg | Freq: Two times a day (BID) | INTRAMUSCULAR | Status: DC
  Administered 2020-06-10 – 2020-06-12 (×4): 40 mg via INTRAVENOUS
  Filled 2020-06-10 (×4): qty 4

## 2020-06-10 MED ORDER — IOHEXOL 350 MG/ML SOLN
75.0000 mL | Freq: Once | INTRAVENOUS | Status: AC | PRN
  Administered 2020-06-10: 75 mL via INTRAVENOUS

## 2020-06-10 MED ORDER — SODIUM CHLORIDE 0.9 % IV SOLN: 10.0000 mL/h | Freq: Once | INTRAVENOUS | Status: DC

## 2020-06-10 MED ORDER — DOCUSATE SODIUM 100 MG PO CAPS
100.0000 mg | ORAL_CAPSULE | Freq: Two times a day (BID) | ORAL | Status: DC
  Administered 2020-06-10 – 2020-06-12 (×4): 100 mg via ORAL
  Filled 2020-06-10 (×4): qty 1

## 2020-06-10 MED ORDER — ACETAMINOPHEN 325 MG PO TABS: 650.0000 mg | ORAL_TABLET | Freq: Four times a day (QID) | ORAL | Status: DC | PRN

## 2020-06-10 MED ORDER — ONDANSETRON HCL 4 MG/2ML IJ SOLN: 4.0000 mg | Freq: Four times a day (QID) | INTRAMUSCULAR | Status: DC | PRN

## 2020-06-10 MED ORDER — TAMSULOSIN HCL 0.4 MG PO CAPS
0.4000 mg | ORAL_CAPSULE | Freq: Every day | ORAL | Status: DC
  Administered 2020-06-10 – 2020-06-12 (×3): 0.4 mg via ORAL
  Filled 2020-06-10 (×3): qty 1

## 2020-06-10 MED ORDER — ONDANSETRON HCL 4 MG PO TABS: 4.0000 mg | ORAL_TABLET | Freq: Four times a day (QID) | ORAL | Status: DC | PRN

## 2020-06-10 MED ORDER — HYDROCODONE-ACETAMINOPHEN 5-325 MG PO TABS: 1.0000 | ORAL_TABLET | ORAL | Status: DC | PRN

## 2020-06-10 MED ORDER — LEVOTHYROXINE SODIUM 25 MCG PO TABS
25.0000 ug | ORAL_TABLET | Freq: Every day | ORAL | Status: DC
  Administered 2020-06-11 – 2020-06-12 (×2): 25 ug via ORAL
  Filled 2020-06-10 (×2): qty 1

## 2020-06-10 MED ORDER — FINASTERIDE 5 MG PO TABS
5.0000 mg | ORAL_TABLET | Freq: Every day | ORAL | Status: DC
  Administered 2020-06-11 – 2020-06-12 (×2): 5 mg via ORAL
  Filled 2020-06-10 (×4): qty 1

## 2020-06-10 NOTE — H&P (Signed)
History and Physical    Drew Reed PHX:[id number] DOB: [birth year] DOA: 06/10/2020  PCP: [facility]  Patient coming from: Home  I have personally briefly reviewed patient's old medical records in [place]  Chief Complaint: Fall  HPI: Drew Reed is a 86 y.o. male with medical history significant of hypertension, chronic systolic congestive heart failure, hypothyroidism, presents to the hospital after having a fall.  Patient had a fall yesterday where he describes his left leg giving out.  Denies any dizziness, head trauma or loss consciousness.  He had difficulty getting up after the fall.  Complains of pain in his left hip, left side chest.  He did not have any vomiting, diarrhea, abdominal pain.  He has not noticed any melena or hematochezia.  He has had no fever cough.  He does report shortness of breath on exertion.  His wife had noted that he had developing edema in his lower extremities over the last several days.  ED Course: He was evaluated emergency room where CT scan of his chest indicated left-sided rib fractures.  No evidence of pulmonary embolus.  Cardiac enzymes were unrevealing.  He was noted to have a hemoglobin of 7 with microcytosis.  Stool was occult positive blood.  He was ordered 1 unit of PRBC for symptomatic anemia.  X-rays of left hip did not show any evidence of fracture or dislocation.  He has been referred for admission.  Review of Systems:  Review of Systems  Constitutional: Negative for chills and fever.  HENT: Negative for congestion and sore throat.   Eyes: Negative for blurred vision and double vision.  Respiratory: Positive for shortness of breath. Negative for cough.   Cardiovascular: Positive for leg swelling. Negative for chest pain.  Gastrointestinal: Negative for abdominal pain, blood in stool, diarrhea, melena and vomiting.  Genitourinary: Positive for frequency.  Musculoskeletal: Positive for falls and joint pain.    Neurological: Positive for weakness. Negative for dizziness and loss of consciousness.      Past Medical History:  Diagnosis Date  . Chronic systolic heart failure ([place])   . Hypertension   . Kidney infection    2010  . Thyroid disease     Past Surgical History:  Procedure Laterality Date  . CHOLECYSTECTOMY      Social History:  reports that he has never smoked. He does not have any smokeless tobacco history on file. He reports that he does not drink alcohol and does not use drugs.  No Known Allergies  Family history: Family history reviewed and not pertinent  Prior to Admission medications   Medication Sig Start Date End Date Taking? Authorizing Provider  acetaminophen (TYLENOL) 325 MG tablet Take 1 tablet (325 mg total) by mouth every 4 (four) hours as needed for pain or fever. 06/02/13  Yes Delfina Redwood, MD  aspirin EC 81 MG tablet Take 81 mg by mouth daily.   Yes [provider]  carboxymethylcellulose (REFRESH PLUS) 0.5 % SOLN Apply 1 drop to eye 3 (three) times daily as needed.   Yes [provider]  cetirizine (ZYRTEC) 10 MG tablet Take 10 mg by mouth daily as needed for allergies.   Yes [provider]  cholecalciferol (VITAMIN D3) 25 MCG (1000 UT) tablet Take 1,000 Units by mouth daily.   Yes [provider]  docusate sodium (COLACE) 100 MG capsule Take 100 mg by mouth 2 (two) times daily.   Yes [provider]  finasteride (PROSCAR) 5 MG  tablet Take 5 mg by mouth daily.   Yes [provider]  guaifenesin (HUMIBID E) 400 MG TABS tablet Take 400 mg by mouth daily as needed (for congestion).   Yes [provider]  levothyroxine (SYNTHROID, LEVOTHROID) 25 MCG tablet Take 25 mcg by mouth daily before breakfast.   Yes [provider]  magnesium oxide (MAG-OX) 400 MG tablet Take 1 tablet (400 mg total) by mouth daily. 08/16/18  Yes Kathie Dike, MD  tamsulosin (FLOMAX) 0.4 MG CAPS capsule Take  0.4 mg by mouth daily.   Yes [provider]    Physical Exam: Vitals:   06/10/20 1800 06/10/20 1811 06/10/20 1831 06/10/20 1900  BP: 124/83  139/81 130/72  Pulse: 74  80 72  Resp: 17  19 (!) 3  Temp:  (!) 100.5 F (38.1 C) 99.9 F (37.7 C)   TempSrc:  Oral Oral   SpO2: 92% 95% 94% 92%  Weight:      Height:        Constitutional: NAD, calm, comfortable Eyes: PERRL, lids and conjunctivae normal ENMT: Mucous membranes are moist. Posterior pharynx clear of any exudate or lesions.Normal dentition.  Neck: normal, supple, no masses, no thyromegaly Respiratory: Crackles at bases, no wheezing. Normal respiratory effort. No accessory muscle use.  Cardiovascular: Regular rate and rhythm, no murmurs / rubs / gallops.  1+ extremity edema. 2+ pedal pulses. No carotid bruits.  Abdomen: no tenderness, no masses palpated. No hepatosplenomegaly. Bowel sounds positive.  Periumbilical hernia, nontender Musculoskeletal: no clubbing / cyanosis. No joint deformity upper and lower extremities. Good ROM, no contractures. Normal muscle tone.  Skin: no rashes, lesions, ulcers. No induration Neurologic: CN 2-12 grossly intact. Sensation intact, DTR normal. Strength 5/5 in all 4.  Psychiatric: Normal judgment and insight. Alert and oriented x 3. Normal mood.    Labs on Admission: I have personally reviewed following labs and imaging studies  CBC: Recent Labs  Lab 06/10/20 1101  WBC 9.1  NEUTROABS 7.7  HGB 7.0*  HCT 23.5*  MCV 66.8*  PLT 010   Basic Metabolic Panel: Recent Labs  Lab 06/10/20 1101  NA 135  K 4.2  CL 103  CO2 23  GLUCOSE 104*  BUN 22  CREATININE 1.03  CALCIUM 8.5*   GFR: Estimated Creatinine Clearance: 46 mL/min (by C-G formula based on SCr of 1.03 mg/dL). Liver Function Tests: Recent Labs  Lab 06/10/20 1101  AST 15  ALT 13  ALKPHOS 50  BILITOT 1.8*  PROT 6.1*  ALBUMIN 3.4*   No results for input(s): LIPASE, AMYLASE in the last 168 hours. No results  for input(s): AMMONIA in the last 168 hours. Coagulation Profile: No results for input(s): INR, PROTIME in the last 168 hours. Cardiac Enzymes: No results for input(s): CKTOTAL, CKMB, CKMBINDEX, TROPONINI in the last 168 hours. BNP (last 3 results) No results for input(s): PROBNP in the last 8760 hours. HbA1C: No results for input(s): HGBA1C in the last 72 hours. CBG: No results for input(s): GLUCAP in the last 168 hours. Lipid Profile: No results for input(s): CHOL, HDL, LDLCALC, TRIG, CHOLHDL, LDLDIRECT in the last 72 hours. Thyroid Function Tests: No results for input(s): TSH, T4TOTAL, FREET4, T3FREE, THYROIDAB in the last 72 hours. Anemia Panel: Recent Labs    06/10/20 1304  VITAMINB12 288  FOLATE 8.3  FERRITIN 14*  TIBC 412  IRON 14*  RETICCTPCT 1.1   Urine analysis:    Component Value Date/Time   COLORURINE AMBER (A) 08/15/2018 1830  APPEARANCEUR HAZY (A) 08/15/2018 1830   LABSPEC 1.023 08/15/2018 1830   PHURINE 5.0 08/15/2018 1830   GLUCOSEU NEGATIVE 08/15/2018 1830   HGBUR NEGATIVE 08/15/2018 1830   BILIRUBINUR NEGATIVE 08/15/2018 1830   KETONESUR NEGATIVE 08/15/2018 1830   PROTEINUR NEGATIVE 08/15/2018 1830   UROBILINOGEN 0.2 05/28/2013 1125   NITRITE NEGATIVE 08/15/2018 1830   LEUKOCYTESUR TRACE (A) 08/15/2018 1830    Radiological Exams on Admission: DG Chest 2 View  Result Date: 06/10/2020 CLINICAL DATA:  Pain following fall EXAM: CHEST - 2 VIEW COMPARISON:  May 29, 2013 FINDINGS: There is a minimal left pleural effusion. There is scarring in the apices. There is a 1.2 x 1.0 cm nodular opacity overlying the heart anteriorly seen only on the lateral view. There is no edema or airspace opacity. Heart is mildly enlarged with pulmonary vascularity within normal limits. There is aortic atherosclerosis. No adenopathy. No pneumothorax. No bone lesions. IMPRESSION: Nodular opacity measuring 1.2 x 1.0 cm seen anteriorly on the lateral view overlying the heart.  This opacity is not seen on frontal view. It may be prudent to consider noncontrast enhanced chest CT to further evaluate and localize this nodular opacity. Minimal left pleural effusion. Apical regions scarring. No edema or airspace opacity. Mild cardiac enlargement. No pneumothorax. Aortic Atherosclerosis (ICD10-I70.0). Electronically Signed   By: Lowella Grip III M.D.   On: 06/10/2020 10:24   CT Head Wo Contrast  Result Date: 06/10/2020 CLINICAL DATA:  Pain following fall EXAM: CT HEAD WITHOUT CONTRAST TECHNIQUE: Contiguous axial images were obtained from the base of the skull through the vertex without intravenous contrast. COMPARISON:  None. FINDINGS: Brain: There is mild to moderate diffuse atrophy. There is no intracranial mass, hemorrhage, extra-axial fluid collection, or midline shift. There is patchy small vessel disease in the centra semiovale bilaterally. No evident acute infarct. Vascular: No hyperdense vessel. There is calcification in the distal vertebral arteries and carotid siphon regions bilaterally. Skull: Bony calvarium appears intact. Sinuses/Orbits: There is mucosal thickening in several ethmoid air cells. Orbits appear symmetric bilaterally. Evidence of previous scleral banding. Other: Mastoid air cells are clear on the left. Opacification is noted of somewhat hypoplastic mastoids on the right. IMPRESSION: Atrophy with periventricular small vessel disease. No acute infarct evident. No mass or hemorrhage. Foci of arterial vascular calcification at several levels. Mucosal thickening noted in several ethmoid air cells. Opacification of hypoplastic mastoids on the right noted. No appreciable air-fluid levels. Electronically Signed   By: Lowella Grip III M.D.   On: 06/10/2020 10:26   CT Angio Chest PE W and/or Wo Contrast  Result Date: 06/10/2020 CLINICAL DATA:  Chest pain after fall.  Abnormal chest x-ray EXAM: CT ANGIOGRAPHY CHEST WITH CONTRAST TECHNIQUE: Multidetector CT  imaging of the chest was performed using the standard protocol during bolus administration of intravenous contrast. Multiplanar CT image reconstructions and MIPs were obtained to evaluate the vascular anatomy. CONTRAST:  16mL OMNIPAQUE IOHEXOL 350 MG/ML SOLN COMPARISON:  Chest x-ray 06/10/2020 FINDINGS: Cardiovascular: Satisfactory opacification of the pulmonary arteries to the segmental level. No evidence of pulmonary embolism. Ascending thoracic aorta measures 4.0 cm in diameter. There are atherosclerotic calcifications of the aorta and coronary arteries. Cardiomegaly. No pericardial effusion. Mediastinum/Nodes: No enlarged mediastinal, hilar, or axillary lymph nodes. Thyroid gland, trachea, and esophagus demonstrate no significant findings. Lungs/Pleura: Biapical pleuroparenchymal scarring. 6 mm subpleural nodule within the medial aspect of the right upper lobe (series 6, image 79). 6 mm subpleural nodule within the periphery of the right lower lobe (  series 6, image 142). Mild bibasilar atelectasis. No focal airspace consolidation. Trace bilateral pleural effusions. No pneumothorax Upper Abdomen: No acute abnormality. Musculoskeletal: Exaggerated thoracic kyphosis. Vertebral body heights are maintained without evidence of fracture. Scattered endplate Schmorl's nodes. Mildly displaced fracture of the posterior left ninth rib (series 4, image 54). Review of the MIP images confirms the above findings. IMPRESSION: 1. Negative for pulmonary embolism. 2. Mildly displaced fracture of the posterior left ninth rib. No pneumothorax. 3. There are 2 right-sided pulmonary nodules both measuring 6 mm. Non-contrast chest CT at 3-6 months is recommended. If the nodules are stable at time of repeat CT, then future CT at 18-24 months (from today's scan) is considered optional for low-risk patients, but is recommended for high-risk patients. This recommendation follows the consensus statement: Guidelines for Management of  Incidental Pulmonary Nodules Detected on CT Images: From the Fleischner Society 2017; Radiology 2017; 284:228-243. 4. Cardiomegaly with coronary artery and aortic atherosclerosis. (ICD10-I70.0). Electronically Signed   By: Davina Poke D.O.   On: 06/10/2020 14:03   CT Cervical Spine Wo Contrast  Result Date: 06/10/2020 CLINICAL DATA:  Falls EXAM: CT CERVICAL SPINE WITHOUT CONTRAST TECHNIQUE: Multidetector CT imaging of the cervical spine was performed without intravenous contrast. Multiplanar CT image reconstructions were also generated. COMPARISON:  None. FINDINGS: Alignment: No significant listhesis. Skull base and vertebrae: No acute cervical spine fracture. Vertebral body heights are maintained apart from degenerative endplate irregularity. Soft tissues and spinal canal: No prevertebral fluid or swelling. No visible canal hematoma. Disc levels: Multilevel degenerative changes are present including disc space narrowing, endplate osteophytes, and facet and uncovertebral hypertrophy. There is no high-grade osseous encroachment on the spinal canal. Upper chest: Scarring at the lung apices. Other: Calcified plaque at the common carotid bifurcations. IMPRESSION: No acute cervical spine fracture. Electronically Signed   By: Macy Mis M.D.   On: 06/10/2020 10:19   DG Shoulder Left  Result Date: 06/10/2020 CLINICAL DATA:  Pain following fall EXAM: LEFT SHOULDER - 2+ VIEW COMPARISON:  None. FINDINGS: Frontal and Y scapular images were obtained. No fracture or dislocation. There is mild generalized joint space narrowing. No erosive change or intra-articular calcification. Visualized left lung clear. There is aortic atherosclerosis. IMPRESSION: Mild generalized joint space narrowing.  No fracture or dislocation. Aortic Atherosclerosis (ICD10-I70.0). Electronically Signed   By: Lowella Grip III M.D.   On: 06/10/2020 10:28   DG Hip Unilat W or Wo Pelvis 2-3 Views Left  Result Date:  06/10/2020 CLINICAL DATA:  Pain following fall EXAM: DG HIP (WITH OR WITHOUT PELVIS) 2-3V LEFT COMPARISON:  None. FINDINGS: Frontal pelvis as well as frontal and lateral left hip images were obtained. There is no fracture or dislocation. There is slight symmetric narrowing of each hip joint. No erosive change. There is lower lumbar dextroscoliosis with degenerative change in the visualized lower lumbar region. There are foci of iliac artery atherosclerotic calcification bilaterally. IMPRESSION: Mild symmetric narrowing each hip joint. Degenerative change in lower lumbar spine with scoliosis. No fracture or dislocation. Electronically Signed   By: Lowella Grip III M.D.   On: 06/10/2020 10:27    EKG: Independently reviewed.  Atrial fibrillation  Assessment/Plan Active Problems:   Hypothyroidism   Symptomatic anemia   Occult blood positive stool   Iron deficiency anemia due to chronic blood loss   Acute on chronic systolic CHF (congestive heart failure) ([place])   DNR (do not resuscitate)   Fall at home, initial encounter   Multiple fractures of ribs, left  side, initial encounter for closed fracture   Left hip pain     Symptomatic iron deficiency anemia, likely related to chronic blood loss -Patient is being transfused PRBC -May also need iron infusion  Heme positive stools -No evidence of gross bleeding per patient -With advanced age, will avoid colonoscopy -Patient really recently had CT abdomen in 03/2020 at [facility] which showed focal wall thickening and narrowing along the proximal ascending colon raising concern for neoplastic process -We will repeat CT abdomen to further evaluate any underlying colon mass  BPH -Continue on Flomax and finasteride -Check bladder scan for postvoid residual to see if Foley catheter is needed  Acute on chronic systolic congestive heart failure. -Last echo was from 2014 patient ejection fraction of 45 to 50% -He does have some evidence of volume  overload with mildly elevated BNP -Start on IV Lasix -Recheck echocardiogram  Hypothyroidism -Continue levothyroxine  Left sided rib fractures -Pain appears to be controlled -Continue supportive treatment  Recurrent falls -Physical therapy evaluation   DVT prophylaxis: SCD Code Status: DNR, confirmed with patient Family Communication: Updated wife at the bedside Disposition Plan: Discharge home versus placement pending PT eval Consults called:   Admission status: Observation, MedSurg  Kathie Dike MD [facility]   If 7PM-7AM, please contact night-coverage [email or web address]   06/10/2020, 7:44 PM

## 2020-06-10 NOTE — Clinical Social Work Note (Signed)
CSW completed [facility] Emergency Notification. Notification ID is: [id number].

## 2020-06-10 NOTE — ED Triage Notes (Signed)
Pt. Brought in by EMS from home. PT fell twice yesterday and was unable to get out of the bed this morning. Complains of pain in left hip and shoulder. Patient denies hitting head. EMS reported that pt was 81% on RA, put on 4L and improved to 95%.  NAD.

## 2020-06-10 NOTE — ED Notes (Signed)
Pt. To CT

## 2020-06-10 NOTE — ED Provider Notes (Signed)
Emergency Department Provider Note   I have reviewed the triage vital signs and the nursing notes.   HISTORY  Chief Complaint Fall   HPI Drew Reed is a 86 y.o. male presents to the ED from home after fall at home. Per EMS the patient fell twice yesterday and this AM was unable to get up out of bed today. He is unsure if her struck his head. He is primarily complaining of pain in the left hip. Denies CP or SOB. EMS arrived to find patient hypoxemic at 81% which improved 4L [place].     Past Medical History:  Diagnosis Date   Chronic systolic heart failure ([place])    Hypertension    Kidney infection    2010   Thyroid disease     Patient Active Problem List   Diagnosis Date Noted   Symptomatic anemia 06/10/2020   Occult blood positive stool 06/10/2020   Iron deficiency anemia due to chronic blood loss 06/10/2020   Acute on chronic systolic CHF (congestive heart failure) ([place]) 06/10/2020   DNR (do not resuscitate) 06/10/2020   Fall at home, initial encounter 06/10/2020   Multiple fractures of ribs, left side, initial encounter for closed fracture 06/10/2020   Left hip pain 06/10/2020   Leukocytosis 08/16/2018   AKI (acute kidney injury) ([place]) 08/16/2018   Hypothyroidism 08/16/2018   Cellulitis of right upper extremity 08/15/2018   Cholecystostomy care ([place]) 06/10/2013   Atrial fibrillation ([place]) 05/31/2013   Bacteremia 05/31/2013   Hypokalemia 54/65/6812   Chronic systolic heart failure ([place]) 05/31/2013   Essential hypertension, benign 05/31/2013   Septic shock ([place]) 05/28/2013   UTI (urinary tract infection) 05/28/2013   Cholecystitis, acute 05/28/2013   Chronic kidney disease (CKD), stage IV (severe) ([place]) 05/28/2013    Past Surgical History:  Procedure Laterality Date   CHOLECYSTECTOMY      Allergies Patient has no known allergies.  History reviewed. No pertinent family history.  Social History Social History   Tobacco Use     Smoking status: Never Smoker   Smokeless tobacco: Never Used  Substance Use Topics   Alcohol use: No   Drug use: No    Review of Systems  Constitutional: No fever/chills Eyes: No visual changes. ENT: No sore throat. Cardiovascular: Denies chest pain. Respiratory: Denies shortness of breath. Gastrointestinal: No abdominal pain.  No nausea, no vomiting.  No diarrhea.  No constipation. Musculoskeletal: Negative for back pain. Positive left hip and shoulder pain.  Skin: Negative for rash. Neurological: Negative for headaches, focal weakness or numbness.  10-point ROS otherwise negative.  ____________________________________________   PHYSICAL EXAM:  VITAL SIGNS: ED Triage Vitals  Enc Vitals Group     BP 06/10/20 0919 120/70     Pulse Rate 06/10/20 0919 71     Resp 06/10/20 0919 18     Temp 06/10/20 0919 98.6 F (37 C)     Temp Source 06/10/20 0919 Oral     SpO2 06/10/20 0919 97 %     Weight 06/10/20 0930 175 lb (79.4 kg)     Height 06/10/20 0930 6\' 3"  (1.905 m)   Constitutional: Alert and oriented. Well appearing and in no acute distress. Eyes: Conjunctivae are normal.  Head: Atraumatic. Nose: No congestion/rhinnorhea. Mouth/Throat: Mucous membranes are moist.  Neck: No stridor.  No cervical spine tenderness to palpation. Cardiovascular: Normal rate, regular rhythm. Good peripheral circulation. Grossly normal heart sounds.   Respiratory: Normal respiratory effort.  No retractions. Lungs CTAB. Gastrointestinal: Soft and nontender.  No distention. Rectal exam performed with patient consent and nurse chaperone. Brown stool without fissures or hemorrhoids.  Musculoskeletal: No upper or lower extremity deformity. Pain with passive ROM of the left hip but normal on the right. No knee or ankle tenderness.  Neurologic:  Normal speech and language. No gross focal neurologic deficits are appreciated.  Skin:  Skin is warm, dry and intact. No rash  noted.  ____________________________________________   LABS (all labs ordered are listed, but only abnormal results are displayed)  Labs Reviewed  COMPREHENSIVE METABOLIC PANEL - Abnormal; Notable for the following components:      Result Value   Glucose, Bld 104 (*)    Calcium 8.5 (*)    Total Protein 6.1 (*)    Albumin 3.4 (*)    Total Bilirubin 1.8 (*)    All other components within normal limits  BRAIN NATRIURETIC PEPTIDE - Abnormal; Notable for the following components:   B Natriuretic Peptide 687.0 (*)    All other components within normal limits  CBC WITH DIFFERENTIAL/PLATELET - Abnormal; Notable for the following components:   RBC 3.52 (*)    Hemoglobin 7.0 (*)    HCT 23.5 (*)    MCV 66.8 (*)    MCH 19.9 (*)    MCHC 29.8 (*)    RDW 19.4 (*)    Lymphs Abs 0.4 (*)    All other components within normal limits  IRON AND TIBC - Abnormal; Notable for the following components:   Iron 14 (*)    Saturation Ratios 3 (*)    All other components within normal limits  FERRITIN - Abnormal; Notable for the following components:   Ferritin 14 (*)    All other components within normal limits  RETICULOCYTES - Abnormal; Notable for the following components:   RBC. 3.73 (*)    Immature Retic Fract 26.6 (*)    All other components within normal limits  CBC - Abnormal; Notable for the following components:   RBC 3.64 (*)    Hemoglobin 7.6 (*)    HCT 23.9 (*)    MCV 65.7 (*)    MCH 20.9 (*)    RDW 19.9 (*)    All other components within normal limits  BASIC METABOLIC PANEL - Abnormal; Notable for the following components:   Sodium 131 (*)    Potassium 3.3 (*)    Glucose, Bld 105 (*)    BUN 27 (*)    Calcium 8.1 (*)    GFR, Estimated 58 (*)    All other components within normal limits  BRAIN NATRIURETIC PEPTIDE - Abnormal; Notable for the following components:   B Natriuretic Peptide 648.0 (*)    All other components within normal limits  POC OCCULT BLOOD, ED - Abnormal;  Notable for the following components:   Fecal Occult Bld POSITIVE (*)    All other components within normal limits  TROPONIN I (HIGH SENSITIVITY) - Abnormal; Notable for the following components:   Troponin I (High Sensitivity) 62 (*)    All other components within normal limits  TROPONIN I (HIGH SENSITIVITY) - Abnormal; Notable for the following components:   Troponin I (High Sensitivity) 67 (*)    All other components within normal limits  RESPIRATORY PANEL BY RT PCR (FLU A&B, COVID)  VITAMIN B12  FOLATE  PATHOLOGIST SMEAR REVIEW  PREPARE RBC (CROSSMATCH)  TYPE AND SCREEN   ____________________________________________  EKG   EKG Interpretation  Date/Time:  Thursday June 10 2020 11:13:11 EDT Ventricular Rate:  70 PR Interval:    QRS Duration: 146 QT Interval:  430 QTC Calculation: 464 R Axis:   -75 Text Interpretation: Atrial fibrillation with a competing junctional pacemaker with premature ventricular or aberrantly conducted complexes Left axis deviation Right bundle branch block Abnormal ECG Similar to Dec 2019 tracing No STEMI Confirmed by Nanda Quinton [phone]) on 06/10/2020 11:34:08 AM       ____________________________________________  RADIOLOGY  DG Chest 2 View  Result Date: 06/10/2020 CLINICAL DATA:  Pain following fall EXAM: CHEST - 2 VIEW COMPARISON:  May 29, 2013 FINDINGS: There is a minimal left pleural effusion. There is scarring in the apices. There is a 1.2 x 1.0 cm nodular opacity overlying the heart anteriorly seen only on the lateral view. There is no edema or airspace opacity. Heart is mildly enlarged with pulmonary vascularity within normal limits. There is aortic atherosclerosis. No adenopathy. No pneumothorax. No bone lesions. IMPRESSION: Nodular opacity measuring 1.2 x 1.0 cm seen anteriorly on the lateral view overlying the heart. This opacity is not seen on frontal view. It may be prudent to consider noncontrast enhanced chest CT to further  evaluate and localize this nodular opacity. Minimal left pleural effusion. Apical regions scarring. No edema or airspace opacity. Mild cardiac enlargement. No pneumothorax. Aortic Atherosclerosis (ICD10-I70.0). Electronically Signed   By: Lowella Grip III M.D.   On: 06/10/2020 10:24   CT Head Wo Contrast  Result Date: 06/10/2020 CLINICAL DATA:  Pain following fall EXAM: CT HEAD WITHOUT CONTRAST TECHNIQUE: Contiguous axial images were obtained from the base of the skull through the vertex without intravenous contrast. COMPARISON:  None. FINDINGS: Brain: There is mild to moderate diffuse atrophy. There is no intracranial mass, hemorrhage, extra-axial fluid collection, or midline shift. There is patchy small vessel disease in the centra semiovale bilaterally. No evident acute infarct. Vascular: No hyperdense vessel. There is calcification in the distal vertebral arteries and carotid siphon regions bilaterally. Skull: Bony calvarium appears intact. Sinuses/Orbits: There is mucosal thickening in several ethmoid air cells. Orbits appear symmetric bilaterally. Evidence of previous scleral banding. Other: Mastoid air cells are clear on the left. Opacification is noted of somewhat hypoplastic mastoids on the right. IMPRESSION: Atrophy with periventricular small vessel disease. No acute infarct evident. No mass or hemorrhage. Foci of arterial vascular calcification at several levels. Mucosal thickening noted in several ethmoid air cells. Opacification of hypoplastic mastoids on the right noted. No appreciable air-fluid levels. Electronically Signed   By: Lowella Grip III M.D.   On: 06/10/2020 10:26   CT Angio Chest PE W and/or Wo Contrast  Result Date: 06/10/2020 CLINICAL DATA:  Chest pain after fall.  Abnormal chest x-ray EXAM: CT ANGIOGRAPHY CHEST WITH CONTRAST TECHNIQUE: Multidetector CT imaging of the chest was performed using the standard protocol during bolus administration of intravenous contrast.  Multiplanar CT image reconstructions and MIPs were obtained to evaluate the vascular anatomy. CONTRAST:  24mL OMNIPAQUE IOHEXOL 350 MG/ML SOLN COMPARISON:  Chest x-ray 06/10/2020 FINDINGS: Cardiovascular: Satisfactory opacification of the pulmonary arteries to the segmental level. No evidence of pulmonary embolism. Ascending thoracic aorta measures 4.0 cm in diameter. There are atherosclerotic calcifications of the aorta and coronary arteries. Cardiomegaly. No pericardial effusion. Mediastinum/Nodes: No enlarged mediastinal, hilar, or axillary lymph nodes. Thyroid gland, trachea, and esophagus demonstrate no significant findings. Lungs/Pleura: Biapical pleuroparenchymal scarring. 6 mm subpleural nodule within the medial aspect of the right upper lobe (series 6, image 79). 6 mm subpleural nodule within the periphery of the right lower lobe (series  6, image 142). Mild bibasilar atelectasis. No focal airspace consolidation. Trace bilateral pleural effusions. No pneumothorax Upper Abdomen: No acute abnormality. Musculoskeletal: Exaggerated thoracic kyphosis. Vertebral body heights are maintained without evidence of fracture. Scattered endplate Schmorl's nodes. Mildly displaced fracture of the posterior left ninth rib (series 4, image 54). Review of the MIP images confirms the above findings. IMPRESSION: 1. Negative for pulmonary embolism. 2. Mildly displaced fracture of the posterior left ninth rib. No pneumothorax. 3. There are 2 right-sided pulmonary nodules both measuring 6 mm. Non-contrast chest CT at 3-6 months is recommended. If the nodules are stable at time of repeat CT, then future CT at 18-24 months (from today's scan) is considered optional for low-risk patients, but is recommended for high-risk patients. This recommendation follows the consensus statement: Guidelines for Management of Incidental Pulmonary Nodules Detected on CT Images: From the Fleischner Society 2017; Radiology 2017; 284:228-243. 4.  Cardiomegaly with coronary artery and aortic atherosclerosis. (ICD10-I70.0). Electronically Signed   By: Davina Poke D.O.   On: 06/10/2020 14:03   CT Cervical Spine Wo Contrast  Result Date: 06/10/2020 CLINICAL DATA:  Falls EXAM: CT CERVICAL SPINE WITHOUT CONTRAST TECHNIQUE: Multidetector CT imaging of the cervical spine was performed without intravenous contrast. Multiplanar CT image reconstructions were also generated. COMPARISON:  None. FINDINGS: Alignment: No significant listhesis. Skull base and vertebrae: No acute cervical spine fracture. Vertebral body heights are maintained apart from degenerative endplate irregularity. Soft tissues and spinal canal: No prevertebral fluid or swelling. No visible canal hematoma. Disc levels: Multilevel degenerative changes are present including disc space narrowing, endplate osteophytes, and facet and uncovertebral hypertrophy. There is no high-grade osseous encroachment on the spinal canal. Upper chest: Scarring at the lung apices. Other: Calcified plaque at the common carotid bifurcations. IMPRESSION: No acute cervical spine fracture. Electronically Signed   By: Macy Mis M.D.   On: 06/10/2020 10:19   DG Shoulder Left  Result Date: 06/10/2020 CLINICAL DATA:  Pain following fall EXAM: LEFT SHOULDER - 2+ VIEW COMPARISON:  None. FINDINGS: Frontal and Y scapular images were obtained. No fracture or dislocation. There is mild generalized joint space narrowing. No erosive change or intra-articular calcification. Visualized left lung clear. There is aortic atherosclerosis. IMPRESSION: Mild generalized joint space narrowing.  No fracture or dislocation. Aortic Atherosclerosis (ICD10-I70.0). Electronically Signed   By: Lowella Grip III M.D.   On: 06/10/2020 10:28   DG Hip Unilat W or Wo Pelvis 2-3 Views Left  Result Date: 06/10/2020 CLINICAL DATA:  Pain following fall EXAM: DG HIP (WITH OR WITHOUT PELVIS) 2-3V LEFT COMPARISON:  None. FINDINGS: Frontal  pelvis as well as frontal and lateral left hip images were obtained. There is no fracture or dislocation. There is slight symmetric narrowing of each hip joint. No erosive change. There is lower lumbar dextroscoliosis with degenerative change in the visualized lower lumbar region. There are foci of iliac artery atherosclerotic calcification bilaterally. IMPRESSION: Mild symmetric narrowing each hip joint. Degenerative change in lower lumbar spine with scoliosis. No fracture or dislocation. Electronically Signed   By: Lowella Grip III M.D.   On: 06/10/2020 10:27    ____________________________________________   PROCEDURES  Procedure(s) performed:   Procedures  CRITICAL CARE Performed by: Margette Fast Total critical care time: 35 minutes Critical care time was exclusive of separately billable procedures and treating other patients. Critical care was necessary to treat or prevent imminent or life-threatening deterioration. Critical care was time spent personally by me on the following activities: development of treatment plan  with patient and/or surrogate as well as nursing, discussions with consultants, evaluation of patient's response to treatment, examination of patient, obtaining history from patient or surrogate, ordering and performing treatments and interventions, ordering and review of laboratory studies, ordering and review of radiographic studies, pulse oximetry and re-evaluation of patient's condition.  Nanda Quinton, MD Emergency Medicine  ____________________________________________   INITIAL IMPRESSION / ASSESSMENT AND PLAN / ED COURSE  Pertinent labs & imaging results that were available during my care of the patient were reviewed by me and considered in my medical decision making (see chart for details).   Patient presents to the ED with left hip pain after multiple falls. He is hypoxemic here as well. Plan for imaging and labs with CXR given hypoxemia and multiple  falls.   Labs and imaging reviewed.  Patient has a mildly elevated BNP without significant pulmonary edema on chest x-ray.  He remains on nasal cannula oxygen although has been turned down to now 2 L.  Plan for CTA for better evaluation of the chest and to rule out PE.  Patient's hemoglobin came back at 7.  No recent values for comparison.  Have attempted to reach the patient's wife by phone for additional history but there was no answer.  Discussed the lab findings with the patient who provides verbal consent for blood transfusion.  Despite his age she is awake and alert and able to engage in discussion regarding his lab work and treatment.   CTA w/o PE or other acute process. PRBC transfusion ordered. Suspect symptomatic anemia possibly leading to falls. No hip fracture on plain film. Could consider MRI if patient continues to have severe pain to r/o acute fracture. Plan for PRBC transfusion and admit.   Discussed patient's case with TR[facility] to request admission. Patient and family (if present) updated with plan. Care transferred to [facility] service.  I reviewed all nursing notes, vitals, pertinent old records, EKGs, labs, imaging (as available).  ____________________________________________  FINAL CLINICAL IMPRESSION(S) / ED DIAGNOSES  Final diagnoses:  Symptomatic anemia  Hypoxemia  Left hip pain     MEDICATIONS GIVEN DURING THIS VISIT:  Medications  0.9 %  sodium chloride infusion (0 mL/hr Intravenous Hold 06/10/20 1412)  levothyroxine (SYNTHROID) tablet 25 mcg (25 mcg Oral Given 06/11/20 0547)  docusate sodium (COLACE) capsule 100 mg (100 mg Oral Given 06/10/20 2054)  finasteride (PROSCAR) tablet 5 mg (5 mg Oral Not Given 06/10/20 2052)  tamsulosin (FLOMAX) capsule 0.4 mg (0.4 mg Oral Given 06/10/20 2057)  acetaminophen (TYLENOL) tablet 650 mg (has no administration in time range)    Or  acetaminophen (TYLENOL) suppository 650 mg (has no administration in time range)   HYDROcodone-acetaminophen (NORCO/VICODIN) 5-325 MG per tablet 1-2 tablet (has no administration in time range)  ondansetron (ZOFRAN) tablet 4 mg (has no administration in time range)    Or  ondansetron (ZOFRAN) injection 4 mg (has no administration in time range)  furosemide (LASIX) injection 40 mg (40 mg Intravenous Given 06/10/20 2056)  iohexol (OMNIPAQUE) 350 MG/ML injection 75 mL (75 mLs Intravenous Contrast Given 06/10/20 1337)     Note:  This document was prepared using Dragon voice recognition software and may include unintentional dictation errors.  Nanda Quinton, MD, [facility] Emergency Medicine    Minerva Bluett, Wonda Olds, MD 06/11/20 [phone]

## 2020-06-11 ENCOUNTER — Observation Stay (HOSPITAL_COMMUNITY): Payer: No Typology Code available for payment source

## 2020-06-11 ENCOUNTER — Encounter (HOSPITAL_COMMUNITY): Payer: Self-pay | Admitting: Internal Medicine

## 2020-06-11 DIAGNOSIS — D500 Iron deficiency anemia secondary to blood loss (chronic): Secondary | ICD-10-CM | POA: Diagnosis not present

## 2020-06-11 DIAGNOSIS — I5023 Acute on chronic systolic (congestive) heart failure: Secondary | ICD-10-CM | POA: Diagnosis not present

## 2020-06-11 DIAGNOSIS — Z66 Do not resuscitate: Secondary | ICD-10-CM | POA: Diagnosis not present

## 2020-06-11 DIAGNOSIS — E039 Hypothyroidism, unspecified: Secondary | ICD-10-CM | POA: Diagnosis not present

## 2020-06-11 LAB — BPAM RBC
Blood Product Expiration Date: 202110232359
ISSUE DATE / TIME: 202110141743
Unit Type and Rh: 7300

## 2020-06-11 LAB — BASIC METABOLIC PANEL
Anion gap: 10 (ref 5–15)
BUN: 27 mg/dL — ABNORMAL HIGH (ref 8–23)
CO2: 22 mmol/L (ref 22–32)
Calcium: 8.1 mg/dL — ABNORMAL LOW (ref 8.9–10.3)
Chloride: 99 mmol/L (ref 98–111)
Creatinine, Ser: 1.07 mg/dL (ref 0.61–1.24)
GFR, Estimated: 58 mL/min — ABNORMAL LOW (ref >60)
Glucose, Bld: 105 mg/dL — ABNORMAL HIGH (ref 70–99)
Potassium: 3.3 mmol/L — ABNORMAL LOW (ref 3.5–5.1)
Sodium: 131 mmol/L — ABNORMAL LOW (ref 135–145)

## 2020-06-11 LAB — CBC
HCT: 23.9 % — ABNORMAL LOW (ref 39.0–52.0)
Hemoglobin: 7.6 g/dL — ABNORMAL LOW (ref 13.0–17.0)
MCH: 20.9 pg — ABNORMAL LOW (ref 26.0–34.0)
MCHC: 31.8 g/dL (ref 30.0–36.0)
MCV: 65.7 fL — ABNORMAL LOW (ref 80.0–100.0)
Platelets: 255 10*3/uL (ref 150–400)
RBC: 3.64 MIL/uL — ABNORMAL LOW (ref 4.22–5.81)
RDW: 19.9 % — ABNORMAL HIGH (ref 11.5–15.5)
WBC: 9.8 10*3/uL (ref 4.0–10.5)
nRBC: 0 % (ref 0.0–0.2)

## 2020-06-11 LAB — TYPE AND SCREEN
ABO/RH(D): B POS
Antibody Screen: NEGATIVE
Unit division: 0

## 2020-06-11 LAB — ECHOCARDIOGRAM COMPLETE
Area-P 1/2: 4.13 cm2
Height: 75 in
S' Lateral: 3.66 cm
Weight: 2737.23 oz

## 2020-06-11 LAB — PATHOLOGIST SMEAR REVIEW

## 2020-06-11 MED ORDER — IOHEXOL 300 MG/ML  SOLN
80.0000 mL | Freq: Once | INTRAMUSCULAR | Status: AC | PRN
  Administered 2020-06-11: 80 mL via INTRAVENOUS

## 2020-06-11 MED ORDER — IOHEXOL 9 MG/ML PO SOLN
ORAL | Status: AC
  Filled 2020-06-11: qty 1000

## 2020-06-11 MED ORDER — SODIUM CHLORIDE 0.9 % IV SOLN
510.0000 mg | Freq: Once | INTRAVENOUS | Status: AC
  Administered 2020-06-11: 510 mg via INTRAVENOUS
  Filled 2020-06-11: qty 17

## 2020-06-11 MED ORDER — POTASSIUM CHLORIDE CRYS ER 20 MEQ PO TBCR
40.0000 meq | EXTENDED_RELEASE_TABLET | Freq: Once | ORAL | Status: AC
  Administered 2020-06-11: 40 meq via ORAL
  Filled 2020-06-11: qty 2

## 2020-06-11 MED ORDER — FERUMOXYTOL INJECTION 510 MG/17 ML
INTRAVENOUS | Status: AC
  Filled 2020-06-11: qty 17

## 2020-06-11 NOTE — TOC Initial Note (Signed)
Transition of Care [facility]) - Initial/Assessment Note    Patient Details  Name: Drew Reed MRN: [id number] Date of Birth: 1920-04-01  Transition of Care [facility]) CM/SW Contact:    Quinnley Colasurdo, Chauncey Reading, RN Phone Number: 06/11/2020, 12:20 PM  Clinical Narrative:    86 y.o. male with medical history significant of hypertension, chronic systolic congestive heart failure, hypothyroidism, presents to the hospital after having a fall. From home with wife, has walker at home. Recommended for home health PT. Patient defers to wife on decision. Wife reports patient needs home health PT and goes to [facility]. Spoke with Bubba Hales, SW at [facility]- faxed over H&P and PT evaluation to [phone] (Dr. Algernon Huxley office).  Referred to [facility].  Anticipate patient may also need HH RN also. Linda aware.    Expected Discharge Plan: [place] Barriers to Discharge: Continued Medical Work up   Patient Goals and CMS Choice Patient states their goals for this hospitalization and ongoing recovery are:: return home, defers to wife in regards to home health CMS Medicare.gov Compare Post Acute Care list provided to:: Patient (and wife) Choice offered to / list presented to : Patient, Spouse  Expected Discharge Plan and Services Expected Discharge Plan: [place]   Discharge Planning Services: CM Consult Post Acute Care Choice: Home Health                             HH Arranged: RN, PT [facility] Agency: [place] ([facility]) Date [place]: 06/11/20 Time [place]: 1220 Representative spoke with at [place]: Vaughan Basta  Prior Living Arrangements/Services   Lives with:: Spouse   Do you feel safe going back to the place where you live?: Yes      Need for Family Participation in Patient Care: Yes (Comment) Care giver support system in place?: Yes (comment) Current home services: DME     Activities of Daily Living Home Assistive Devices/Equipment: Environmental consultant (specify type), Wheelchair, Electric scooter ADL Screening (condition at time of admission) Patient's cognitive ability adequate to safely complete daily activities?: Yes Is the patient deaf or have difficulty hearing?: No Does the patient have difficulty seeing, even when wearing glasses/contacts?: No Does the patient have difficulty concentrating, remembering, or making decisions?: No Patient able to express need for assistance with ADLs?: Yes Does the patient have difficulty dressing or bathing?: No Independently performs ADLs?: Yes (appropriate for developmental age) Does the patient have difficulty walking or climbing stairs?: Yes Weakness of Legs: Both Weakness of Arms/Hands: Both  Permission Sought/Granted                  Emotional Assessment     Affect (typically observed): Accepting Orientation: : Oriented to Self, Oriented to Place, Oriented to  Time, Oriented to Situation      Admission diagnosis:  Symptomatic anemia [D64.9] Patient Active Problem List   Diagnosis Date Noted  . Symptomatic anemia 06/10/2020  . Occult blood positive stool 06/10/2020  . Iron deficiency anemia due to chronic blood loss 06/10/2020  . Acute on chronic systolic CHF (congestive heart failure) ([place]) 06/10/2020  . DNR (do not resuscitate) 06/10/2020  . Fall at home, initial encounter 06/10/2020  . Multiple fractures of ribs, left side, initial encounter for closed fracture 06/10/2020  . Left hip pain 06/10/2020  . Leukocytosis 08/16/2018  . AKI (acute kidney injury) ([place])  08/16/2018  . Hypothyroidism 08/16/2018  . Cellulitis of right upper extremity 08/15/2018  . Cholecystostomy care ([place]) 06/10/2013  . Atrial fibrillation ([place]) 05/31/2013  . Bacteremia 05/31/2013  . Hypokalemia 05/31/2013  . Chronic systolic heart failure ([place]) 05/31/2013  . Essential hypertension, benign 05/31/2013  . Septic shock ([place])  05/28/2013  . UTI (urinary tract infection) 05/28/2013  . Cholecystitis, acute 05/28/2013  . Chronic kidney disease (CKD), stage IV (severe) ([place]) 05/28/2013   PCP:  Clinic, [place]:   [place], [place] [address] [place] [address] Phone: [phone] Fax: [phone]     Social Determinants of Health (SDOH) Interventions    Readmission Risk Interventions No flowsheet data found.

## 2020-06-11 NOTE — Plan of Care (Signed)
  Problem: Acute Rehab PT Goals(only PT should resolve) Goal: Pt Will Go Supine/Side To Sit Outcome: Progressing Flowsheets (Taken 06/11/2020 0926) Pt will go Supine/Side to Sit: with modified independence Goal: Pt Will Go Sit To Supine/Side Outcome: Progressing Flowsheets (Taken 06/11/2020 0926) Pt will go Sit to Supine/Side: with modified independence Goal: Patient Will Transfer Sit To/From Stand Outcome: Progressing Flowsheets (Taken 06/11/2020 0926) Patient will transfer sit to/from stand: with supervision Goal: Pt Will Transfer Bed To Chair/Chair To Bed Outcome: Progressing Flowsheets (Taken 06/11/2020 0926) Pt will Transfer Bed to Chair/Chair to Bed: with supervision Goal: Pt Will Perform Standing Balance Or Pre-Gait Outcome: Progressing Flowsheets (Taken 06/11/2020 0926) Pt will perform standing balance or pre-gait: with Supervision Goal: Pt/caregiver will Perform Home Exercise Program Outcome: Progressing Flowsheets (Taken 06/11/2020 0926) Pt/caregiver will Perform Home Exercise Program:  For increased strengthening  For improved balance  Independently  9:27 AM, 06/11/20 Mearl Latin PT, DPT Physical Therapist at [facility]

## 2020-06-11 NOTE — Progress Notes (Signed)
PROGRESS NOTE    Drew Reed  XTK:[id number] DOB: 14-Nov-1919 DOA: 06/10/2020 PCP: [facility]    Brief Narrative:  Drew Reed is a 86 y.o. male with medical history significant of hypertension, chronic systolic congestive heart failure, hypothyroidism, presents to the hospital after having a fall.  Patient had a fall yesterday where he describes his left leg giving out.  Denies any dizziness, head trauma or loss consciousness.  He had difficulty getting up after the fall.  Complains of pain in his left hip, left side chest.  He did not have any vomiting, diarrhea, abdominal pain.  He has not noticed any melena or hematochezia.  He has had no fever cough.  He does report shortness of breath on exertion.  His wife had noted that he had developing edema in his lower extremities over the last several days.   Assessment & Plan:   Active Problems:   Hypothyroidism   Symptomatic anemia   Occult blood positive stool   Iron deficiency anemia due to chronic blood loss   Acute on chronic systolic CHF (congestive heart failure) (HCC)   DNR (do not resuscitate)   Fall at home, initial encounter   Multiple fractures of ribs, left side, initial encounter for closed fracture   Left hip pain   Symptomatic iron deficiency anemia, likely related to chronic blood loss from GI tract. -He was transfused 1 unit of PRBC on 10/14 -Follow-up hemoglobin improved to 7.6 -We will provide iron infusion -Start on oral iron replacement therapy on discharge  Heme positive stools -No evidence of gross blood per patient CT of the abdomen and pelvis did not show any clear colon mass, although there was some thickening in right colon -With advanced age, would not pursue further work-up including colonoscopy  BPH -Continue on Flomax and finasteride -Check bladder scan for postvoid residual  Hypothyroidism. -Continue Synthroid  Left-sided rib fractures -Continue pain management  Acute on  chronic systolic congestive heart failure. -Repeat echo report pending -BNP elevated in the 600 range -Still has evidence of volume overload -Continue on IV Lasix  Recurrent falls -Seen by physical therapy and felt to be unsteady -Recommendations were for home health physical therapy -We will need to ask physical therapy to reevaluate patient since her discharge him and his elderly wife at home and it appears that he only ambulated 10 feet with PT.   DVT prophylaxis: SCDs Start: 06/10/20 1937  Code Status: DNR Family Communication: tried contacting patient's wife over the phone, but unable to reach her Disposition Plan: Status is: Observation  The patient remains OBS appropriate and will d/c before 2 midnights.  Dispo: The patient is from: Home              Anticipated d/c is to: Home              Anticipated d/c date is: 1 day              Patient currently is not medically stable to d/c.    Consultants:     Procedures:     Antimicrobials:       Subjective: Still feels weak.  Feels no different than yesterday.  Denies any shortness of breath or chest pain.  Objective: Vitals:   06/11/20 0000 06/11/20 0441 06/11/20 0500 06/11/20 1717  BP: 133/73 (!) 111/57  137/73  Pulse: 74 89  66  Resp: 16 18  18   Temp: 98.3 F (36.8 C) 98.1 F (36.7 C)  98.9 F (37.2 C)  TempSrc: Oral Oral  Oral  SpO2: 98% 93%  96%  Weight:   77.6 kg   Height:        Intake/Output Summary (Last 24 hours) at 06/11/2020 1747 Last data filed at 06/11/2020 1247 Gross per 24 hour  Intake 675 ml  Output 1100 ml  Net -425 ml   Filed Weights   06/10/20 0930 06/11/20 0500  Weight: 79.4 kg 77.6 kg    Examination:  General exam: Appears calm and comfortable  Respiratory system: Crackles at bases. Respiratory effort normal. Cardiovascular system: S1 & S2 heard, RRR. No JVD, murmurs, rubs, gallops or clicks. No pedal edema. Gastrointestinal system: Abdomen is nondistended, soft and  nontender. No organomegaly or masses felt. Normal bowel sounds heard. Central nervous system: Alert and oriented. No focal neurological deficits. Extremities: 1+ edema bilaterally Skin: No rashes, lesions or ulcers Psychiatry: Judgement and insight appear normal. Mood & affect appropriate.     Data Reviewed: I have personally reviewed following labs and imaging studies  CBC: Recent Labs  Lab 06/10/20 1101 06/11/20 0534  WBC 9.1 9.8  NEUTROABS 7.7  --   HGB 7.0* 7.6*  HCT 23.5* 23.9*  MCV 66.8* 65.7*  PLT 285 283   Basic Metabolic Panel: Recent Labs  Lab 06/10/20 1101 06/11/20 0534  NA 135 131*  K 4.2 3.3*  CL 103 99  CO2 23 22  GLUCOSE 104* 105*  BUN 22 27*  CREATININE 1.03 1.07  CALCIUM 8.5* 8.1*   GFR: Estimated Creatinine Clearance: 43.3 mL/min (by C-G formula based on SCr of 1.07 mg/dL). Liver Function Tests: Recent Labs  Lab 06/10/20 1101  AST 15  ALT 13  ALKPHOS 50  BILITOT 1.8*  PROT 6.1*  ALBUMIN 3.4*   No results for input(s): LIPASE, AMYLASE in the last 168 hours. No results for input(s): AMMONIA in the last 168 hours. Coagulation Profile: No results for input(s): INR, PROTIME in the last 168 hours. Cardiac Enzymes: No results for input(s): CKTOTAL, CKMB, CKMBINDEX, TROPONINI in the last 168 hours. BNP (last 3 results) No results for input(s): PROBNP in the last 8760 hours. HbA1C: No results for input(s): HGBA1C in the last 72 hours. CBG: No results for input(s): GLUCAP in the last 168 hours. Lipid Profile: No results for input(s): CHOL, HDL, LDLCALC, TRIG, CHOLHDL, LDLDIRECT in the last 72 hours. Thyroid Function Tests: No results for input(s): TSH, T4TOTAL, FREET4, T3FREE, THYROIDAB in the last 72 hours. Anemia Panel: Recent Labs    06/10/20 1304  VITAMINB12 288  FOLATE 8.3  FERRITIN 14*  TIBC 412  IRON 14*  RETICCTPCT 1.1   Sepsis Labs: No results for input(s): PROCALCITON, LATICACIDVEN in the last 168 hours.  Recent Results  (from the past 240 hour(s))  Respiratory Panel by RT PCR (Flu A&B, Covid) - Nasopharyngeal Swab     Status: None   Collection Time: 06/10/20 11:26 AM   Specimen: Nasopharyngeal Swab  Result Value Ref Range Status   SARS Coronavirus 2 by RT PCR NEGATIVE NEGATIVE Final    Comment: (NOTE) SARS-CoV-2 target nucleic acids are NOT DETECTED.  The SARS-CoV-2 RNA is generally detectable in upper respiratoy specimens during the acute phase of infection. The lowest concentration of SARS-CoV-2 viral copies this assay can detect is 131 copies/mL. A negative result does not preclude SARS-Cov-2 infection and should not be used as the sole basis for treatment or other patient management decisions. A negative result may occur with  improper specimen collection/handling, submission  of specimen other than nasopharyngeal swab, presence of viral mutation(s) within the areas targeted by this assay, and inadequate number of viral copies (<131 copies/mL). A negative result must be combined with clinical observations, patient history, and epidemiological information. The expected result is Negative.  Fact Sheet for Patients:  PinkCheek.be  Fact Sheet for Healthcare Providers:  GravelBags.it  This test is no t yet approved or cleared by the [place] FDA and  has been authorized for detection and/or diagnosis of SARS-CoV-2 by FDA under an Emergency Use Authorization (EUA). This EUA will remain  in effect (meaning this test can be used) for the duration of the COVID-19 declaration under Section 564(b)(1) of the Act, 21 U.S.C. section 360bbb-3(b)(1), unless the authorization is terminated or revoked sooner.     Influenza A by PCR NEGATIVE NEGATIVE Final   Influenza B by PCR NEGATIVE NEGATIVE Final    Comment: (NOTE) The Xpert Xpress SARS-CoV-2/FLU/RSV assay is intended as an aid in  the diagnosis of influenza from Nasopharyngeal swab specimens  and  should not be used as a sole basis for treatment. Nasal washings and  aspirates are unacceptable for Xpert Xpress SARS-CoV-2/FLU/RSV  testing.  Fact Sheet for Patients: PinkCheek.be  Fact Sheet for Healthcare Providers: GravelBags.it  This test is not yet approved or cleared by the [place] FDA and  has been authorized for detection and/or diagnosis of SARS-CoV-2 by  FDA under an Emergency Use Authorization (EUA). This EUA will remain  in effect (meaning this test can be used) for the duration of the  Covid-19 declaration under Section 564(b)(1) of the Act, 21  U.S.C. section 360bbb-3(b)(1), unless the authorization is  terminated or revoked. Performed at [facility], [address] [place], [place] [address]          Radiology Studies: DG Chest 2 View  Result Date: 06/10/2020 CLINICAL DATA:  Pain following fall EXAM: CHEST - 2 VIEW COMPARISON:  May 29, 2013 FINDINGS: There is a minimal left pleural effusion. There is scarring in the apices. There is a 1.2 x 1.0 cm nodular opacity overlying the heart anteriorly seen only on the lateral view. There is no edema or airspace opacity. Heart is mildly enlarged with pulmonary vascularity within normal limits. There is aortic atherosclerosis. No adenopathy. No pneumothorax. No bone lesions. IMPRESSION: Nodular opacity measuring 1.2 x 1.0 cm seen anteriorly on the lateral view overlying the heart. This opacity is not seen on frontal view. It may be prudent to consider noncontrast enhanced chest CT to further evaluate and localize this nodular opacity. Minimal left pleural effusion. Apical regions scarring. No edema or airspace opacity. Mild cardiac enlargement. No pneumothorax. Aortic Atherosclerosis (ICD10-I70.0). Electronically Signed   By: Lowella Grip III M.D.   On: 06/10/2020 10:24   CT Head Wo Contrast  Result Date: 06/10/2020 CLINICAL DATA:  Pain following  fall EXAM: CT HEAD WITHOUT CONTRAST TECHNIQUE: Contiguous axial images were obtained from the base of the skull through the vertex without intravenous contrast. COMPARISON:  None. FINDINGS: Brain: There is mild to moderate diffuse atrophy. There is no intracranial mass, hemorrhage, extra-axial fluid collection, or midline shift. There is patchy small vessel disease in the centra semiovale bilaterally. No evident acute infarct. Vascular: No hyperdense vessel. There is calcification in the distal vertebral arteries and carotid siphon regions bilaterally. Skull: Bony calvarium appears intact. Sinuses/Orbits: There is mucosal thickening in several ethmoid air cells. Orbits appear symmetric bilaterally. Evidence of previous scleral banding. Other: Mastoid air cells are clear  on the left. Opacification is noted of somewhat hypoplastic mastoids on the right. IMPRESSION: Atrophy with periventricular small vessel disease. No acute infarct evident. No mass or hemorrhage. Foci of arterial vascular calcification at several levels. Mucosal thickening noted in several ethmoid air cells. Opacification of hypoplastic mastoids on the right noted. No appreciable air-fluid levels. Electronically Signed   By: Lowella Grip III M.D.   On: 06/10/2020 10:26   CT Angio Chest PE W and/or Wo Contrast  Result Date: 06/10/2020 CLINICAL DATA:  Chest pain after fall.  Abnormal chest x-ray EXAM: CT ANGIOGRAPHY CHEST WITH CONTRAST TECHNIQUE: Multidetector CT imaging of the chest was performed using the standard protocol during bolus administration of intravenous contrast. Multiplanar CT image reconstructions and MIPs were obtained to evaluate the vascular anatomy. CONTRAST:  7mL OMNIPAQUE IOHEXOL 350 MG/ML SOLN COMPARISON:  Chest x-ray 06/10/2020 FINDINGS: Cardiovascular: Satisfactory opacification of the pulmonary arteries to the segmental level. No evidence of pulmonary embolism. Ascending thoracic aorta measures 4.0 cm in diameter.  There are atherosclerotic calcifications of the aorta and coronary arteries. Cardiomegaly. No pericardial effusion. Mediastinum/Nodes: No enlarged mediastinal, hilar, or axillary lymph nodes. Thyroid gland, trachea, and esophagus demonstrate no significant findings. Lungs/Pleura: Biapical pleuroparenchymal scarring. 6 mm subpleural nodule within the medial aspect of the right upper lobe (series 6, image 79). 6 mm subpleural nodule within the periphery of the right lower lobe (series 6, image 142). Mild bibasilar atelectasis. No focal airspace consolidation. Trace bilateral pleural effusions. No pneumothorax Upper Abdomen: No acute abnormality. Musculoskeletal: Exaggerated thoracic kyphosis. Vertebral body heights are maintained without evidence of fracture. Scattered endplate Schmorl's nodes. Mildly displaced fracture of the posterior left ninth rib (series 4, image 54). Review of the MIP images confirms the above findings. IMPRESSION: 1. Negative for pulmonary embolism. 2. Mildly displaced fracture of the posterior left ninth rib. No pneumothorax. 3. There are 2 right-sided pulmonary nodules both measuring 6 mm. Non-contrast chest CT at 3-6 months is recommended. If the nodules are stable at time of repeat CT, then future CT at 18-24 months (from today's scan) is considered optional for low-risk patients, but is recommended for high-risk patients. This recommendation follows the consensus statement: Guidelines for Management of Incidental Pulmonary Nodules Detected on CT Images: From the Fleischner Society 2017; Radiology 2017; 284:228-243. 4. Cardiomegaly with coronary artery and aortic atherosclerosis. (ICD10-I70.0). Electronically Signed   By: Davina Poke D.O.   On: 06/10/2020 14:03   CT Cervical Spine Wo Contrast  Result Date: 06/10/2020 CLINICAL DATA:  Falls EXAM: CT CERVICAL SPINE WITHOUT CONTRAST TECHNIQUE: Multidetector CT imaging of the cervical spine was performed without intravenous contrast.  Multiplanar CT image reconstructions were also generated. COMPARISON:  None. FINDINGS: Alignment: No significant listhesis. Skull base and vertebrae: No acute cervical spine fracture. Vertebral body heights are maintained apart from degenerative endplate irregularity. Soft tissues and spinal canal: No prevertebral fluid or swelling. No visible canal hematoma. Disc levels: Multilevel degenerative changes are present including disc space narrowing, endplate osteophytes, and facet and uncovertebral hypertrophy. There is no high-grade osseous encroachment on the spinal canal. Upper chest: Scarring at the lung apices. Other: Calcified plaque at the common carotid bifurcations. IMPRESSION: No acute cervical spine fracture. Electronically Signed   By: Macy Mis M.D.   On: 06/10/2020 10:19   CT ABDOMEN PELVIS W CONTRAST  Result Date: 06/11/2020 CLINICAL DATA:  Colorectal cancer staging. Reported recent outside CT demonstrating focal wall thickening and luminal narrowing of the ascending colon. Iron deficiency anemia. EXAM: CT ABDOMEN AND  PELVIS WITH CONTRAST TECHNIQUE: Multidetector CT imaging of the abdomen and pelvis was performed using the standard protocol following bolus administration of intravenous contrast. CONTRAST:  36mL OMNIPAQUE IOHEXOL 300 MG/ML  SOLN COMPARISON:  Abdominopelvic CT 05/28/2013. The referenced more recent outside study is not available. FINDINGS: Lower chest: Stable cardiomegaly. There are small bilateral pleural effusions with mild dependent bibasilar atelectasis. No suspicious pulmonary nodularity. Hepatobiliary: The liver is normal in density without suspicious focal abnormality. The gallbladder is incompletely distended. No evidence of gallstones, gallbladder wall thickening or biliary dilatation. Pancreas: Diffusely atrophied. No evidence of pancreatic mass, ductal dilatation or surrounding inflammation. Spleen: Calcified granulomas.  Otherwise unremarkable in appearance.  Adrenals/Urinary Tract: Stable 15 mm right adrenal nodule on image 15/3, consistent with an adenoma. The left adrenal gland appears normal. There is a nonobstructing 3 mm calculus in the upper pole of the right kidney. There are small cysts in the upper pole of the left kidney. No evidence of hydronephrosis or ureteral calculus. There is a 5 mm bladder calculus on image 64/3. The bladder is mildly distended with intermediate density fluid, possibly from hematuria. No apparent bladder wall thickening or surrounding inflammation. Stomach/Bowel: Enteric contrast was administered and has passed into the distal small bowel. The stomach appears normal. There is no small bowel distension, wall thickening or surrounding inflammation. Small bowel extends into a left inguinal hernia. There is no evidence of incarceration or obstruction. The enteric contrast has not yet passed through the terminal ileum or the colon. The cecum is fluid-filled with possible mild wall thickening but no gross mass lesion. Mild sigmoid diverticulosis. No evidence of colonic obstruction or perforation. Vascular/Lymphatic: There are no enlarged abdominal or pelvic lymph nodes. Aortic and branch vessel atherosclerosis. No acute vascular findings. The portal, superior mesenteric and splenic veins are patent. Reproductive: Moderate enlargement of the prostate gland, asymmetric to the right, grossly similar from previous study. Other: Left inguinal hernia has mildly enlarged compared with the prior CT. This contains small bowel and fluid. Previously, the sigmoid colon extended into this hernia. No evidence of incarceration. Stable umbilical hernia containing fat. No ascites or peritoneal nodularity. Musculoskeletal: No acute or significant osseous findings. Mild lumbar spondylosis for age with a mild convex right scoliosis. IMPRESSION: 1. No evidence of metastatic disease in the abdomen or pelvis. Fluid-filled cecum with possible mild wall thickening,  but no obvious colonic mass. 2. Mildly enlarged left inguinal hernia containing small bowel and fluid. Previously, the sigmoid colon extended into this hernia. No evidence of incarceration or obstruction. 3. Nonobstructing right renal and bladder calculi. Mildly distended bladder with intermediate density fluid, possibly from hematuria. 4. Stable right adrenal adenoma. 5. Aortic Atherosclerosis (ICD10-I70.0). Electronically Signed   By: Richardean Sale M.D.   On: 06/11/2020 12:28   DG Shoulder Left  Result Date: 06/10/2020 CLINICAL DATA:  Pain following fall EXAM: LEFT SHOULDER - 2+ VIEW COMPARISON:  None. FINDINGS: Frontal and Y scapular images were obtained. No fracture or dislocation. There is mild generalized joint space narrowing. No erosive change or intra-articular calcification. Visualized left lung clear. There is aortic atherosclerosis. IMPRESSION: Mild generalized joint space narrowing.  No fracture or dislocation. Aortic Atherosclerosis (ICD10-I70.0). Electronically Signed   By: Lowella Grip III M.D.   On: 06/10/2020 10:28   ECHOCARDIOGRAM COMPLETE  Result Date: 06/11/2020    ECHOCARDIOGRAM REPORT   Patient Name:   ILLYA GIENGER Date of Exam: 06/11/2020 Medical Rec #:  [id number]       Height:  75.0 in Accession #:    [id number]      Weight:       171.1 lb Date of Birth:  11-25-1919       BSA:          2.053 m Patient Age:    22 years        BP:           111/57 mmHg Patient Gender: M               HR:           65 bpm. Exam Location:  [facility] Procedure: 2D Echo, Cardiac Doppler and Color Doppler Indications:    I50.23 Acute on chronic systolic (congestive) heart failure  History:        Patient has prior history of Echocardiogram examinations, most                 recent 05/29/2013. Risk Factors:Hypertension. Thyroid Disease.  Sonographer:    Jonelle Sidle Dance Referring Phys: [place]  1. Left ventricular ejection fraction, by estimation, is 55%. The left  ventricle has normal function. The left ventricle has no regional wall motion abnormalities. The left ventricular internal cavity size was mildly dilated. There is mild left ventricular hypertrophy. Left ventricular diastolic parameters are indeterminate.  2. Right ventricular systolic function is mildly reduced. The right ventricular size is normal. There is mildly elevated pulmonary artery systolic pressure.  3. Left atrial size was severely dilated.  4. Right atrial size was severely dilated.  5. Mild mitral valve regurgitation.  6. Tricuspid valve regurgitation is mild to moderate.  7. Aortic valve regurgitation is mild. Mild aortic valve sclerosis is present, with no evidence of aortic valve stenosis.  8. Aortic dilatation noted. There is mild dilatation of the aortic root, measuring 43 mm. There is mild dilatation of the ascending aorta, measuring 41 mm.  9. The inferior vena cava is normal in size with greater than 50% respiratory variability, suggesting right atrial pressure of 3 mmHg. FINDINGS  Left Ventricle: Left ventricular ejection fraction, by estimation, is 55%. The left ventricle has normal function. The left ventricle has no regional wall motion abnormalities. The left ventricular internal cavity size was mildly dilated. There is mild left ventricular hypertrophy. Left ventricular diastolic parameters are indeterminate. Right Ventricle: The right ventricular size is normal. Right vetricular wall thickness was not assessed. Right ventricular systolic function is mildly reduced. There is mildly elevated pulmonary artery systolic pressure. The tricuspid regurgitant velocity is 2.81 m/s, and with an assumed right atrial pressure of 8 mmHg, the estimated right ventricular systolic pressure is 21.3 mmHg. Left Atrium: Left atrial size was severely dilated. Right Atrium: Right atrial size was severely dilated. Pericardium: There is no evidence of pericardial effusion. Mitral Valve: The mitral valve is  normal in structure. Mild mitral valve regurgitation. Tricuspid Valve: The tricuspid valve is normal in structure. Tricuspid valve regurgitation is mild to moderate. Aortic Valve: The aortic valve is abnormal. Aortic valve regurgitation is mild. Mild aortic valve sclerosis is present, with no evidence of aortic valve stenosis. Pulmonic Valve: The pulmonic valve was normal in structure. Pulmonic valve regurgitation is mild. Aorta: Aortic dilatation noted. There is mild dilatation of the aortic root, measuring 43 mm. There is mild dilatation of the ascending aorta, measuring 41 mm. Venous: The inferior vena cava is normal in size with greater than 50% respiratory variability, suggesting right atrial pressure of 3 mmHg. IAS/Shunts: No atrial level shunt  detected by color flow Doppler.  LEFT VENTRICLE PLAX 2D LVIDd:         5.67 cm LVIDs:         3.66 cm LV PW:         1.41 cm LV IVS:        1.45 cm LVOT diam:     2.30 cm LV SV:         72 LV SV Index:   35 LVOT Area:     4.15 cm  RIGHT VENTRICLE RV Basal diam:  3.74 cm RV Mid diam:    1.74 cm TAPSE (M-mode): 1.9 cm LEFT ATRIUM              Index        RIGHT ATRIUM           Index LA diam:        5.60 cm  2.73 cm/m   RA Area:     41.00 cm LA Vol (A2C):   246.0 ml 119.80 ml/m RA Volume:   158.00 ml 76.94 ml/m LA Vol (A4C):   245.0 ml 119.31 ml/m LA Biplane Vol: 247.0 ml 120.29 ml/m  AORTIC VALVE LVOT Vmax:   98.80 cm/s LVOT Vmean:  64.750 cm/s LVOT VTI:    0.173 m  AORTA Ao Root diam: 4.30 cm Ao Asc diam:  4.00 cm MITRAL VALVE                TRICUSPID VALVE MV Area (PHT): 4.13 cm     TR Peak grad:   31.6 mmHg MV Decel Time: 184 msec     TR Vmax:        281.00 cm/s MV E velocity: 106.25 cm/s                             SHUNTS                             Systemic VTI:  0.17 m                             Systemic Diam: 2.30 cm Dorris Carnes MD Electronically signed by Dorris Carnes MD Signature Date/Time: 06/11/2020/5:44:51 PM    Final    DG Hip Unilat W or Wo Pelvis  2-3 Views Left  Result Date: 06/10/2020 CLINICAL DATA:  Pain following fall EXAM: DG HIP (WITH OR WITHOUT PELVIS) 2-3V LEFT COMPARISON:  None. FINDINGS: Frontal pelvis as well as frontal and lateral left hip images were obtained. There is no fracture or dislocation. There is slight symmetric narrowing of each hip joint. No erosive change. There is lower lumbar dextroscoliosis with degenerative change in the visualized lower lumbar region. There are foci of iliac artery atherosclerotic calcification bilaterally. IMPRESSION: Mild symmetric narrowing each hip joint. Degenerative change in lower lumbar spine with scoliosis. No fracture or dislocation. Electronically Signed   By: Lowella Grip III M.D.   On: 06/10/2020 10:27        Scheduled Meds: . docusate sodium  100 mg Oral BID  . finasteride  5 mg Oral Daily  . furosemide  40 mg Intravenous BID  . iohexol      . levothyroxine  25 mcg Oral QAC breakfast  . potassium chloride  40 mEq Oral Once  . tamsulosin  0.4 mg Oral Daily  Continuous Infusions: . sodium chloride Stopped (06/10/20 1412)  . ferumoxytol       LOS: 0 days    Time spent: 74mins    Kathie Dike, MD [facility]   If 7PM-7AM, please contact night-coverage [email or web address]  06/11/2020, 5:47 PM

## 2020-06-11 NOTE — Progress Notes (Signed)
  Echocardiogram 2D Echocardiogram has been performed.  Kortni Hasten G Orenthal Debski 06/11/2020, 2:25 PM

## 2020-06-11 NOTE — Evaluation (Signed)
Physical Therapy Evaluation Patient Details Name: Drew Reed MRN: [id number] DOB: 06-Sep-1919 Today's Date: 06/11/2020   History of Present Illness  Drew Reed is a 86 y.o. male with medical history significant of hypertension, chronic systolic congestive heart failure, hypothyroidism, presents to the hospital after having a fall.  Patient had a fall yesterday where he describes his left leg giving out.  Denies any dizziness, head trauma or loss consciousness.  He had difficulty getting up after the fall.  Complains of pain in his left hip, left side chest.  He did not have any vomiting, diarrhea, abdominal pain.  He has not noticed any melena or hematochezia.  He has had no fever cough.  He does report shortness of breath on exertion.  His wife had noted that he had developing edema in his lower extremities over the last several days.    Clinical Impression  Patient limited for functional mobility as stated below secondary to BLE weakness, fatigue and impaired standing balance. Patient with slow, labored transition to seated EOB. He demonstrates good sitting tolerance and sitting balance EOB. Patient transfer to standing without AD and demonstrates unsteadiness upon standing. Patient ambulates in room without loss of balance and intermittently reaches for walls for support. Patient ended session seated in chair and set up for breakfast. Patient educated on using RW upon returning home for safety and balance to reduce the risk of falls. Patient will benefit from continued physical therapy in hospital and recommended venue below to increase strength, balance, endurance for safe ADLs and gait.     Follow Up Recommendations Home health PT;Supervision/Assistance - 24 hour;Supervision for mobility/OOB    Equipment Recommendations  None recommended by PT    Recommendations for Other Services       Precautions / Restrictions Precautions Precautions: Fall Restrictions Weight Bearing  Restrictions: No      Mobility  Bed Mobility Overal bed mobility: Needs Assistance Bed Mobility: Supine to Sit     Supine to sit: Min guard     General bed mobility comments: to transition to seated EOB, slow, labored  Transfers Overall transfer level: Needs assistance Equipment used: None Transfers: Sit to/from [facility] Sit to Stand: Min guard Stand pivot transfers: Min guard       General transfer comment: slighlty unsteady upon initial standing  Ambulation/Gait Ambulation/Gait assistance: Min guard Gait Distance (Feet): 10 Feet Assistive device: None Gait Pattern/deviations: Decreased step length - right;Decreased step length - left;Decreased stride length Gait velocity: decrease   General Gait Details: slow, labored, patient states near baseline, reaches for nearby walls for support intermittently  Stairs            Wheelchair Mobility    Modified Rankin (Stroke Patients Only)       Balance Overall balance assessment: Needs assistance Sitting-balance support: Feet supported;No upper extremity supported Sitting balance-Leahy Scale: Good Sitting balance - Comments: seated EOB   Standing balance support: No upper extremity supported Standing balance-Leahy Scale: Fair Standing balance comment: without AD                             Pertinent Vitals/Pain Pain Assessment: No/denies pain    Home Living Family/patient expects to be discharged to:: Private residence Living Arrangements: Spouse/significant other Available Help at Discharge: Family Type of Home: House Home Access: Stairs to enter Entrance Stairs-Rails: Right Entrance Stairs-Number of Steps: 4 Home Layout: One level Home Equipment: Environmental consultant - 2 wheels;Cane -  single point      Prior Function Level of Independence: Needs assistance   Gait / Transfers Assistance Needed: Patient states household ambulator with [facility]  ADL's / Homemaking Assistance Needed:  Patient states independent        Hand Dominance        Extremity/Trunk Assessment   Upper Extremity Assessment Upper Extremity Assessment: Overall WFL for tasks assessed    Lower Extremity Assessment Lower Extremity Assessment: Generalized weakness       Communication   Communication: HOH (hears better with L ear)  Cognition Arousal/Alertness: Awake/alert Behavior During Therapy: WFL for tasks assessed/performed Overall Cognitive Status: Within Functional Limits for tasks assessed                                        General Comments      Exercises     Assessment/Plan    PT Assessment Patient needs continued PT services  PT Problem List Decreased strength;Decreased mobility;Decreased activity tolerance;Decreased balance       PT Treatment Interventions DME instruction;Therapeutic exercise;Balance training;Gait training;Stair training;Neuromuscular re-education;Functional mobility training;Therapeutic activities;Patient/family education    PT Goals (Current goals can be found in the Care Plan section)  Acute Rehab PT Goals Patient Stated Goal: Return home PT Goal Formulation: With patient Time For Goal Achievement: 06/25/20 Potential to Achieve Goals: Good    Frequency Min 3X/week   Barriers to discharge        Co-evaluation               AM-PAC PT "6 Clicks" Mobility  Outcome Measure Help needed turning from your back to your side while in a flat bed without using bedrails?: None Help needed moving from lying on your back to sitting on the side of a flat bed without using bedrails?: A Little Help needed moving to and from a bed to a chair (including a wheelchair)?: A Little Help needed standing up from a chair using your arms (e.g., wheelchair or bedside chair)?: A Little Help needed to walk in hospital room?: A Lot Help needed climbing 3-5 steps with a railing? : A Lot 6 Click Score: 17    End of Session Equipment Utilized  During Treatment: Gait belt;Oxygen Activity Tolerance: Patient tolerated treatment well Patient left: in chair;with call bell/phone within reach;with chair alarm set Nurse Communication: Mobility status PT Visit Diagnosis: Unsteadiness on feet (R26.81);Other abnormalities of gait and mobility (R26.89);History of falling (Z91.81);Muscle weakness (generalized) (M62.81)    Time: 3893-7342 PT Time Calculation (min) (ACUTE ONLY): 18 min   Charges:   PT Evaluation $PT Eval Moderate Complexity: 1 Mod PT Treatments $Therapeutic Activity: 8-22 mins       9:24 AM, 06/11/20 Mearl Latin PT, DPT Physical Therapist at [facility]

## 2020-06-12 DIAGNOSIS — R296 Repeated falls: Secondary | ICD-10-CM | POA: Diagnosis present

## 2020-06-12 DIAGNOSIS — E039 Hypothyroidism, unspecified: Secondary | ICD-10-CM | POA: Diagnosis not present

## 2020-06-12 DIAGNOSIS — Z20822 Contact with and (suspected) exposure to covid-19: Secondary | ICD-10-CM | POA: Diagnosis present

## 2020-06-12 DIAGNOSIS — D649 Anemia, unspecified: Secondary | ICD-10-CM | POA: Diagnosis present

## 2020-06-12 DIAGNOSIS — Z9049 Acquired absence of other specified parts of digestive tract: Secondary | ICD-10-CM | POA: Diagnosis not present

## 2020-06-12 DIAGNOSIS — D500 Iron deficiency anemia secondary to blood loss (chronic): Secondary | ICD-10-CM | POA: Diagnosis not present

## 2020-06-12 DIAGNOSIS — S2242XA Multiple fractures of ribs, left side, initial encounter for closed fracture: Secondary | ICD-10-CM | POA: Diagnosis present

## 2020-06-12 DIAGNOSIS — Y92009 Unspecified place in unspecified non-institutional (private) residence as the place of occurrence of the external cause: Secondary | ICD-10-CM | POA: Diagnosis not present

## 2020-06-12 DIAGNOSIS — R0902 Hypoxemia: Secondary | ICD-10-CM | POA: Diagnosis present

## 2020-06-12 DIAGNOSIS — N400 Enlarged prostate without lower urinary tract symptoms: Secondary | ICD-10-CM | POA: Diagnosis present

## 2020-06-12 DIAGNOSIS — W19XXXA Unspecified fall, initial encounter: Secondary | ICD-10-CM | POA: Diagnosis present

## 2020-06-12 DIAGNOSIS — I5023 Acute on chronic systolic (congestive) heart failure: Secondary | ICD-10-CM | POA: Diagnosis not present

## 2020-06-12 DIAGNOSIS — M25552 Pain in left hip: Secondary | ICD-10-CM | POA: Diagnosis present

## 2020-06-12 DIAGNOSIS — I110 Hypertensive heart disease with heart failure: Secondary | ICD-10-CM | POA: Diagnosis present

## 2020-06-12 DIAGNOSIS — I5043 Acute on chronic combined systolic (congestive) and diastolic (congestive) heart failure: Secondary | ICD-10-CM | POA: Diagnosis present

## 2020-06-12 DIAGNOSIS — Z66 Do not resuscitate: Secondary | ICD-10-CM | POA: Diagnosis present

## 2020-06-12 LAB — CBC
HCT: 25.8 % — ABNORMAL LOW (ref 39.0–52.0)
Hemoglobin: 8.3 g/dL — ABNORMAL LOW (ref 13.0–17.0)
MCH: 21.1 pg — ABNORMAL LOW (ref 26.0–34.0)
MCHC: 32.2 g/dL (ref 30.0–36.0)
MCV: 65.6 fL — ABNORMAL LOW (ref 80.0–100.0)
Platelets: 298 10*3/uL (ref 150–400)
RBC: 3.93 MIL/uL — ABNORMAL LOW (ref 4.22–5.81)
RDW: 20.5 % — ABNORMAL HIGH (ref 11.5–15.5)
WBC: 7.9 10*3/uL (ref 4.0–10.5)
nRBC: 0 % (ref 0.0–0.2)

## 2020-06-12 LAB — BASIC METABOLIC PANEL
Anion gap: 8 (ref 5–15)
BUN: 26 mg/dL — ABNORMAL HIGH (ref 8–23)
CO2: 26 mmol/L (ref 22–32)
Calcium: 8.3 mg/dL — ABNORMAL LOW (ref 8.9–10.3)
Chloride: 96 mmol/L — ABNORMAL LOW (ref 98–111)
Creatinine, Ser: 1.06 mg/dL (ref 0.61–1.24)
GFR, Estimated: 59 mL/min — ABNORMAL LOW (ref >60)
Glucose, Bld: 119 mg/dL — ABNORMAL HIGH (ref 70–99)
Potassium: 3.5 mmol/L (ref 3.5–5.1)
Sodium: 130 mmol/L — ABNORMAL LOW (ref 135–145)

## 2020-06-12 MED ORDER — POTASSIUM CHLORIDE ER 20 MEQ PO TBCR: 20.0000 meq | EXTENDED_RELEASE_TABLET | Freq: Every day | ORAL | 1 refills | Status: AC

## 2020-06-12 MED ORDER — FUROSEMIDE 40 MG PO TABS: 40.0000 mg | ORAL_TABLET | Freq: Every day | ORAL | 1 refills | Status: AC

## 2020-06-12 MED ORDER — FERROUS SULFATE 325 (65 FE) MG PO TBEC: 325.0000 mg | DELAYED_RELEASE_TABLET | Freq: Two times a day (BID) | ORAL | 3 refills | Status: AC

## 2020-06-12 NOTE — Progress Notes (Signed)
Nsg Discharge Note  Admit Date:  06/10/2020 Discharge date: 06/12/2020   Nolene Ebbs to be D/C'd Home per MD order.  AVS completed.  Copy for chart, and copy for patient signed, and dated. Patient/caregiver able to verbalize understanding.  Discharge Medication: Allergies as of 06/12/2020   No Known Allergies     Medication List    STOP taking these medications   aspirin EC 81 MG tablet     TAKE these medications   acetaminophen 325 MG tablet Commonly known as: TYLENOL Take 1 tablet (325 mg total) by mouth every 4 (four) hours as needed for pain or fever.   carboxymethylcellulose 0.5 % Soln Commonly known as: REFRESH PLUS Apply 1 drop to eye 3 (three) times daily as needed.   cetirizine 10 MG tablet Commonly known as: ZYRTEC Take 10 mg by mouth daily as needed for allergies.   cholecalciferol 25 MCG (1000 UNIT) tablet Commonly known as: VITAMIN D3 Take 1,000 Units by mouth daily.   docusate sodium 100 MG capsule Commonly known as: COLACE Take 100 mg by mouth 2 (two) times daily.   ferrous sulfate 325 (65 FE) MG EC tablet Take 1 tablet (325 mg total) by mouth 2 (two) times daily.   finasteride 5 MG tablet Commonly known as: PROSCAR Take 5 mg by mouth daily.   furosemide 40 MG tablet Commonly known as: Lasix Take 1 tablet (40 mg total) by mouth daily.   guaifenesin 400 MG Tabs tablet Commonly known as: HUMIBID E Take 400 mg by mouth daily as needed (for congestion).   levothyroxine 25 MCG tablet Commonly known as: SYNTHROID Take 25 mcg by mouth daily before breakfast.   magnesium oxide 400 MG tablet Commonly known as: MAG-OX Take 1 tablet (400 mg total) by mouth daily.   Potassium Chloride ER 20 MEQ Tbcr Take 20 mEq by mouth daily.   tamsulosin 0.4 MG Caps capsule Commonly known as: FLOMAX Take 0.4 mg by mouth daily.       Discharge Assessment: Vitals:   06/12/20 0430 06/12/20 1412  BP: (!) 107/48 111/69  Pulse: 68 (!) 56  Resp: 16 20   Temp: 98.1 F (36.7 C) 98.8 F (37.1 C)  SpO2: 94% 96%   Skin clean, dry and intact without evidence of skin break down, no evidence of skin tears noted. IV catheter discontinued intact. Site without signs and symptoms of complications - no redness or edema noted at insertion site, patient denies c/o pain - only slight tenderness at site.  Dressing with slight pressure applied.  D/c Instructions-Education: Discharge instructions given to patient/family with verbalized understanding. D/c education completed with patient/family including follow up instructions, medication list, d/c activities limitations if indicated, with other d/c instructions as indicated by MD - patient able to verbalize understanding, all questions fully answered. Patient instructed to return to ED, call 911, or call MD for any changes in condition.  Patient escorted via [place], and D/C home via private auto.  Loa Socks, RN 06/12/2020 5:47 PM

## 2020-06-12 NOTE — Progress Notes (Deleted)
Physician Discharge Summary  Drew Reed ASN:[id number] DOB: [birth year] DOA: 06/10/2020  PCP: [facility]  Admit date: 06/10/2020 Discharge date: 06/12/2020  Admitted From: Home Disposition: Home  Recommendations for Outpatient Follow-up:  1. Follow up with PCP in 1-2 weeks 2. Please obtain BMP/CBC in one week  Home Health: Home health PT Equipment/Devices:  Discharge Condition: Stable CODE STATUS: DNR Diet recommendation: Heart healthy  Brief/Interim Summary: Drew Reed a 86 y.o.malewith medical history significant ofhypertension, chronic systolic congestive heart failure, hypothyroidism, presents to the hospital after having a fall. Patient had a fall yesterday where he describes his left leg giving out. Denies any dizziness, head trauma or loss consciousness. He had difficulty getting up after the fall. Complains of pain in his left hip, left side chest. He did not have any vomiting, diarrhea, abdominal pain. He has not noticed any melena or hematochezia. He has had no fever cough. He does report shortness of breath on exertion. His wife had noted that he had developing edema in his lower extremities over the last several days.  Discharge Diagnoses:  Active Problems:   Hypothyroidism   Symptomatic anemia   Occult blood positive stool   Iron deficiency anemia due to chronic blood loss   Acute on chronic systolic CHF (congestive heart failure) (HCC)   DNR (do not resuscitate)   Fall at home, initial encounter   Multiple fractures of ribs, left side, initial encounter for closed fracture   Left hip pain  Symptomatic iron deficiency anemia, likely related to chronic blood loss from GI tract. -He was transfused 1 unit of PRBC on 10/14 -Follow-up hemoglobin improved to 8.3 -He also received iron infusion -Start on oral iron replacement therapy on discharge  Heme positive stools -No evidence of gross blood per patient CT of the abdomen and  pelvis did not show any obvious colon mass, although there was some thickening in right colon -With advanced age, would not pursue further work-up including colonoscopy -Patient and wife are in agreement  BPH -Continue on Flomax and finasteride -Bladder scan did not show significant postvoid residual -He has been able to pass his urine  Hypothyroidism. -Continue Synthroid  Left-sided rib fractures -Continue pain management  Acute on chronic diastolic congestive heart failure. -Echocardiogram shows ejection fraction of 55% -BNP elevated in the 600 range -Diuresed with IV Lasix with improvement of volume status -He has been transitioned to oral Lasix  Recurrent falls -Seen by physical therapy with recommendations for home health PT  Discharge Instructions  Discharge Instructions    Diet - low sodium heart healthy   Complete by: As directed    Increase activity slowly   Complete by: As directed      Allergies as of 06/12/2020   No Known Allergies     Medication List    STOP taking these medications   aspirin EC 81 MG tablet     TAKE these medications   acetaminophen 325 MG tablet Commonly known as: TYLENOL Take 1 tablet (325 mg total) by mouth every 4 (four) hours as needed for pain or fever.   carboxymethylcellulose 0.5 % Soln Commonly known as: REFRESH PLUS Apply 1 drop to eye 3 (three) times daily as needed.   cetirizine 10 MG tablet Commonly known as: ZYRTEC Take 10 mg by mouth daily as needed for allergies.   cholecalciferol 25 MCG (1000 UNIT) tablet Commonly known as: VITAMIN D3 Take 1,000 Units by mouth daily.   docusate sodium 100 MG capsule Commonly known  as: COLACE Take 100 mg by mouth 2 (two) times daily.   ferrous sulfate 325 (65 FE) MG EC tablet Take 1 tablet (325 mg total) by mouth 2 (two) times daily.   finasteride 5 MG tablet Commonly known as: PROSCAR Take 5 mg by mouth daily.   furosemide 40 MG tablet Commonly known as:  Lasix Take 1 tablet (40 mg total) by mouth daily.   guaifenesin 400 MG Tabs tablet Commonly known as: HUMIBID E Take 400 mg by mouth daily as needed (for congestion).   levothyroxine 25 MCG tablet Commonly known as: SYNTHROID Take 25 mcg by mouth daily before breakfast.   magnesium oxide 400 MG tablet Commonly known as: MAG-OX Take 1 tablet (400 mg total) by mouth daily.   Potassium Chloride ER 20 MEQ Tbcr Take 20 mEq by mouth daily.   tamsulosin 0.4 MG Caps capsule Commonly known as: FLOMAX Take 0.4 mg by mouth daily.       No Known Allergies  Consultations:     Procedures/Studies: DG Chest 2 View  Result Date: 06/10/2020 CLINICAL DATA:  Pain following fall EXAM: CHEST - 2 VIEW COMPARISON:  May 29, 2013 FINDINGS: There is a minimal left pleural effusion. There is scarring in the apices. There is a 1.2 x 1.0 cm nodular opacity overlying the heart anteriorly seen only on the lateral view. There is no edema or airspace opacity. Heart is mildly enlarged with pulmonary vascularity within normal limits. There is aortic atherosclerosis. No adenopathy. No pneumothorax. No bone lesions. IMPRESSION: Nodular opacity measuring 1.2 x 1.0 cm seen anteriorly on the lateral view overlying the heart. This opacity is not seen on frontal view. It may be prudent to consider noncontrast enhanced chest CT to further evaluate and localize this nodular opacity. Minimal left pleural effusion. Apical regions scarring. No edema or airspace opacity. Mild cardiac enlargement. No pneumothorax. Aortic Atherosclerosis (ICD10-I70.0). Electronically Signed   By: Lowella Grip III M.D.   On: 06/10/2020 10:24   CT Head Wo Contrast  Result Date: 06/10/2020 CLINICAL DATA:  Pain following fall EXAM: CT HEAD WITHOUT CONTRAST TECHNIQUE: Contiguous axial images were obtained from the base of the skull through the vertex without intravenous contrast. COMPARISON:  None. FINDINGS: Brain: There is mild to  moderate diffuse atrophy. There is no intracranial mass, hemorrhage, extra-axial fluid collection, or midline shift. There is patchy small vessel disease in the centra semiovale bilaterally. No evident acute infarct. Vascular: No hyperdense vessel. There is calcification in the distal vertebral arteries and carotid siphon regions bilaterally. Skull: Bony calvarium appears intact. Sinuses/Orbits: There is mucosal thickening in several ethmoid air cells. Orbits appear symmetric bilaterally. Evidence of previous scleral banding. Other: Mastoid air cells are clear on the left. Opacification is noted of somewhat hypoplastic mastoids on the right. IMPRESSION: Atrophy with periventricular small vessel disease. No acute infarct evident. No mass or hemorrhage. Foci of arterial vascular calcification at several levels. Mucosal thickening noted in several ethmoid air cells. Opacification of hypoplastic mastoids on the right noted. No appreciable air-fluid levels. Electronically Signed   By: Lowella Grip III M.D.   On: 06/10/2020 10:26   CT Angio Chest PE W and/or Wo Contrast  Result Date: 06/10/2020 CLINICAL DATA:  Chest pain after fall.  Abnormal chest x-ray EXAM: CT ANGIOGRAPHY CHEST WITH CONTRAST TECHNIQUE: Multidetector CT imaging of the chest was performed using the standard protocol during bolus administration of intravenous contrast. Multiplanar CT image reconstructions and MIPs were obtained to evaluate the vascular anatomy. CONTRAST:  57mL OMNIPAQUE IOHEXOL 350 MG/ML SOLN COMPARISON:  Chest x-ray 06/10/2020 FINDINGS: Cardiovascular: Satisfactory opacification of the pulmonary arteries to the segmental level. No evidence of pulmonary embolism. Ascending thoracic aorta measures 4.0 cm in diameter. There are atherosclerotic calcifications of the aorta and coronary arteries. Cardiomegaly. No pericardial effusion. Mediastinum/Nodes: No enlarged mediastinal, hilar, or axillary lymph nodes. Thyroid gland, trachea,  and esophagus demonstrate no significant findings. Lungs/Pleura: Biapical pleuroparenchymal scarring. 6 mm subpleural nodule within the medial aspect of the right upper lobe (series 6, image 79). 6 mm subpleural nodule within the periphery of the right lower lobe (series 6, image 142). Mild bibasilar atelectasis. No focal airspace consolidation. Trace bilateral pleural effusions. No pneumothorax Upper Abdomen: No acute abnormality. Musculoskeletal: Exaggerated thoracic kyphosis. Vertebral body heights are maintained without evidence of fracture. Scattered endplate Schmorl's nodes. Mildly displaced fracture of the posterior left ninth rib (series 4, image 54). Review of the MIP images confirms the above findings. IMPRESSION: 1. Negative for pulmonary embolism. 2. Mildly displaced fracture of the posterior left ninth rib. No pneumothorax. 3. There are 2 right-sided pulmonary nodules both measuring 6 mm. Non-contrast chest CT at 3-6 months is recommended. If the nodules are stable at time of repeat CT, then future CT at 18-24 months (from today's scan) is considered optional for low-risk patients, but is recommended for high-risk patients. This recommendation follows the consensus statement: Guidelines for Management of Incidental Pulmonary Nodules Detected on CT Images: From the Fleischner Society 2017; Radiology 2017; 284:228-243. 4. Cardiomegaly with coronary artery and aortic atherosclerosis. (ICD10-I70.0). Electronically Signed   By: Davina Poke D.O.   On: 06/10/2020 14:03   CT Cervical Spine Wo Contrast  Result Date: 06/10/2020 CLINICAL DATA:  Falls EXAM: CT CERVICAL SPINE WITHOUT CONTRAST TECHNIQUE: Multidetector CT imaging of the cervical spine was performed without intravenous contrast. Multiplanar CT image reconstructions were also generated. COMPARISON:  None. FINDINGS: Alignment: No significant listhesis. Skull base and vertebrae: No acute cervical spine fracture. Vertebral body heights are  maintained apart from degenerative endplate irregularity. Soft tissues and spinal canal: No prevertebral fluid or swelling. No visible canal hematoma. Disc levels: Multilevel degenerative changes are present including disc space narrowing, endplate osteophytes, and facet and uncovertebral hypertrophy. There is no high-grade osseous encroachment on the spinal canal. Upper chest: Scarring at the lung apices. Other: Calcified plaque at the common carotid bifurcations. IMPRESSION: No acute cervical spine fracture. Electronically Signed   By: Macy Mis M.D.   On: 06/10/2020 10:19   CT ABDOMEN PELVIS W CONTRAST  Result Date: 06/11/2020 CLINICAL DATA:  Colorectal cancer staging. Reported recent outside CT demonstrating focal wall thickening and luminal narrowing of the ascending colon. Iron deficiency anemia. EXAM: CT ABDOMEN AND PELVIS WITH CONTRAST TECHNIQUE: Multidetector CT imaging of the abdomen and pelvis was performed using the standard protocol following bolus administration of intravenous contrast. CONTRAST:  53mL OMNIPAQUE IOHEXOL 300 MG/ML  SOLN COMPARISON:  Abdominopelvic CT 05/28/2013. The referenced more recent outside study is not available. FINDINGS: Lower chest: Stable cardiomegaly. There are small bilateral pleural effusions with mild dependent bibasilar atelectasis. No suspicious pulmonary nodularity. Hepatobiliary: The liver is normal in density without suspicious focal abnormality. The gallbladder is incompletely distended. No evidence of gallstones, gallbladder wall thickening or biliary dilatation. Pancreas: Diffusely atrophied. No evidence of pancreatic mass, ductal dilatation or surrounding inflammation. Spleen: Calcified granulomas.  Otherwise unremarkable in appearance. Adrenals/Urinary Tract: Stable 15 mm right adrenal nodule on image 15/3, consistent with an adenoma. The left adrenal gland appears normal. There  is a nonobstructing 3 mm calculus in the upper pole of the right kidney.  There are small cysts in the upper pole of the left kidney. No evidence of hydronephrosis or ureteral calculus. There is a 5 mm bladder calculus on image 64/3. The bladder is mildly distended with intermediate density fluid, possibly from hematuria. No apparent bladder wall thickening or surrounding inflammation. Stomach/Bowel: Enteric contrast was administered and has passed into the distal small bowel. The stomach appears normal. There is no small bowel distension, wall thickening or surrounding inflammation. Small bowel extends into a left inguinal hernia. There is no evidence of incarceration or obstruction. The enteric contrast has not yet passed through the terminal ileum or the colon. The cecum is fluid-filled with possible mild wall thickening but no gross mass lesion. Mild sigmoid diverticulosis. No evidence of colonic obstruction or perforation. Vascular/Lymphatic: There are no enlarged abdominal or pelvic lymph nodes. Aortic and branch vessel atherosclerosis. No acute vascular findings. The portal, superior mesenteric and splenic veins are patent. Reproductive: Moderate enlargement of the prostate gland, asymmetric to the right, grossly similar from previous study. Other: Left inguinal hernia has mildly enlarged compared with the prior CT. This contains small bowel and fluid. Previously, the sigmoid colon extended into this hernia. No evidence of incarceration. Stable umbilical hernia containing fat. No ascites or peritoneal nodularity. Musculoskeletal: No acute or significant osseous findings. Mild lumbar spondylosis for age with a mild convex right scoliosis. IMPRESSION: 1. No evidence of metastatic disease in the abdomen or pelvis. Fluid-filled cecum with possible mild wall thickening, but no obvious colonic mass. 2. Mildly enlarged left inguinal hernia containing small bowel and fluid. Previously, the sigmoid colon extended into this hernia. No evidence of incarceration or obstruction. 3.  Nonobstructing right renal and bladder calculi. Mildly distended bladder with intermediate density fluid, possibly from hematuria. 4. Stable right adrenal adenoma. 5. Aortic Atherosclerosis (ICD10-I70.0). Electronically Signed   By: Richardean Sale M.D.   On: 06/11/2020 12:28   DG Shoulder Left  Result Date: 06/10/2020 CLINICAL DATA:  Pain following fall EXAM: LEFT SHOULDER - 2+ VIEW COMPARISON:  None. FINDINGS: Frontal and Y scapular images were obtained. No fracture or dislocation. There is mild generalized joint space narrowing. No erosive change or intra-articular calcification. Visualized left lung clear. There is aortic atherosclerosis. IMPRESSION: Mild generalized joint space narrowing.  No fracture or dislocation. Aortic Atherosclerosis (ICD10-I70.0). Electronically Signed   By: Lowella Grip III M.D.   On: 06/10/2020 10:28   ECHOCARDIOGRAM COMPLETE  Result Date: 06/11/2020    ECHOCARDIOGRAM REPORT   Patient Name:   Drew Reed Date of Exam: 06/11/2020 Medical Rec #:  [id number]       Height:       75.0 in Accession #:    [id number]      Weight:       171.1 lb Date of Birth:  1920-07-22       BSA:          2.053 m Patient Age:    14 years        BP:           111/57 mmHg Patient Gender: M               HR:           65 bpm. Exam Location:  [facility] Procedure: 2D Echo, Cardiac Doppler and Color Doppler Indications:    I50.23 Acute on chronic systolic (congestive) heart failure  History:  Patient has prior history of Echocardiogram examinations, most                 recent 05/29/2013. Risk Factors:Hypertension. Thyroid Disease.  Sonographer:    Jonelle Sidle Dance Referring Phys: [place]  1. Left ventricular ejection fraction, by estimation, is 55%. The left ventricle has normal function. The left ventricle has no regional wall motion abnormalities. The left ventricular internal cavity size was mildly dilated. There is mild left ventricular hypertrophy. Left  ventricular diastolic parameters are indeterminate.  2. Right ventricular systolic function is mildly reduced. The right ventricular size is normal. There is mildly elevated pulmonary artery systolic pressure.  3. Left atrial size was severely dilated.  4. Right atrial size was severely dilated.  5. Mild mitral valve regurgitation.  6. Tricuspid valve regurgitation is mild to moderate.  7. Aortic valve regurgitation is mild. Mild aortic valve sclerosis is present, with no evidence of aortic valve stenosis.  8. Aortic dilatation noted. There is mild dilatation of the aortic root, measuring 43 mm. There is mild dilatation of the ascending aorta, measuring 41 mm.  9. The inferior vena cava is normal in size with greater than 50% respiratory variability, suggesting right atrial pressure of 3 mmHg. FINDINGS  Left Ventricle: Left ventricular ejection fraction, by estimation, is 55%. The left ventricle has normal function. The left ventricle has no regional wall motion abnormalities. The left ventricular internal cavity size was mildly dilated. There is mild left ventricular hypertrophy. Left ventricular diastolic parameters are indeterminate. Right Ventricle: The right ventricular size is normal. Right vetricular wall thickness was not assessed. Right ventricular systolic function is mildly reduced. There is mildly elevated pulmonary artery systolic pressure. The tricuspid regurgitant velocity is 2.81 m/s, and with an assumed right atrial pressure of 8 mmHg, the estimated right ventricular systolic pressure is 37.9 mmHg. Left Atrium: Left atrial size was severely dilated. Right Atrium: Right atrial size was severely dilated. Pericardium: There is no evidence of pericardial effusion. Mitral Valve: The mitral valve is normal in structure. Mild mitral valve regurgitation. Tricuspid Valve: The tricuspid valve is normal in structure. Tricuspid valve regurgitation is mild to moderate. Aortic Valve: The aortic valve is abnormal.  Aortic valve regurgitation is mild. Mild aortic valve sclerosis is present, with no evidence of aortic valve stenosis. Pulmonic Valve: The pulmonic valve was normal in structure. Pulmonic valve regurgitation is mild. Aorta: Aortic dilatation noted. There is mild dilatation of the aortic root, measuring 43 mm. There is mild dilatation of the ascending aorta, measuring 41 mm. Venous: The inferior vena cava is normal in size with greater than 50% respiratory variability, suggesting right atrial pressure of 3 mmHg. IAS/Shunts: No atrial level shunt detected by color flow Doppler.  LEFT VENTRICLE PLAX 2D LVIDd:         5.67 cm LVIDs:         3.66 cm LV PW:         1.41 cm LV IVS:        1.45 cm LVOT diam:     2.30 cm LV SV:         72 LV SV Index:   35 LVOT Area:     4.15 cm  RIGHT VENTRICLE RV Basal diam:  3.74 cm RV Mid diam:    1.74 cm TAPSE (M-mode): 1.9 cm LEFT ATRIUM              Index        RIGHT ATRIUM  Index LA diam:        5.60 cm  2.73 cm/m   RA Area:     41.00 cm LA Vol (A2C):   246.0 ml 119.80 ml/m RA Volume:   158.00 ml 76.94 ml/m LA Vol (A4C):   245.0 ml 119.31 ml/m LA Biplane Vol: 247.0 ml 120.29 ml/m  AORTIC VALVE LVOT Vmax:   98.80 cm/s LVOT Vmean:  64.750 cm/s LVOT VTI:    0.173 m  AORTA Ao Root diam: 4.30 cm Ao Asc diam:  4.00 cm MITRAL VALVE                TRICUSPID VALVE MV Area (PHT): 4.13 cm     TR Peak grad:   31.6 mmHg MV Decel Time: 184 msec     TR Vmax:        281.00 cm/s MV E velocity: 106.25 cm/s                             SHUNTS                             Systemic VTI:  0.17 m                             Systemic Diam: 2.30 cm Dorris Carnes MD Electronically signed by Dorris Carnes MD Signature Date/Time: 06/11/2020/5:44:51 PM    Final    DG Hip Unilat W or Wo Pelvis 2-3 Views Left  Result Date: 06/10/2020 CLINICAL DATA:  Pain following fall EXAM: DG HIP (WITH OR WITHOUT PELVIS) 2-3V LEFT COMPARISON:  None. FINDINGS: Frontal pelvis as well as frontal and lateral left hip  images were obtained. There is no fracture or dislocation. There is slight symmetric narrowing of each hip joint. No erosive change. There is lower lumbar dextroscoliosis with degenerative change in the visualized lower lumbar region. There are foci of iliac artery atherosclerotic calcification bilaterally. IMPRESSION: Mild symmetric narrowing each hip joint. Degenerative change in lower lumbar spine with scoliosis. No fracture or dislocation. Electronically Signed   By: Lowella Grip III M.D.   On: 06/10/2020 10:27       Subjective: Feeling better.  No shortness of breath.  No chest pain.  Discharge Exam: Vitals:   06/11/20 2106 06/12/20 0430 06/12/20 0700 06/12/20 1412  BP: (!) 148/74 (!) 107/48  111/69  Pulse: 88 68  (!) 56  Resp:  16  20  Temp: 98.2 F (36.8 C) 98.1 F (36.7 C)  98.8 F (37.1 C)  TempSrc: Oral   Oral  SpO2: 95% 94%  96%  Weight:   75.3 kg   Height:        General: Pt is alert, awake, not in acute distress Cardiovascular: RRR, S1/S2 +, no rubs, no gallops Respiratory: CTA bilaterally, no wheezing, no rhonchi Abdominal: Soft, NT, ND, bowel sounds + Extremities: no edema, no cyanosis    The results of significant diagnostics from this hospitalization (including imaging, microbiology, ancillary and laboratory) are listed below for reference.     Microbiology: Recent Results (from the past 240 hour(s))  Respiratory Panel by RT PCR (Flu A&B, Covid) - Nasopharyngeal Swab     Status: None   Collection Time: 06/10/20 11:26 AM   Specimen: Nasopharyngeal Swab  Result Value Ref Range Status   SARS Coronavirus 2 by RT PCR NEGATIVE NEGATIVE  Final    Comment: (NOTE) SARS-CoV-2 target nucleic acids are NOT DETECTED.  The SARS-CoV-2 RNA is generally detectable in upper respiratoy specimens during the acute phase of infection. The lowest concentration of SARS-CoV-2 viral copies this assay can detect is 131 copies/mL. A negative result does not preclude  SARS-Cov-2 infection and should not be used as the sole basis for treatment or other patient management decisions. A negative result may occur with  improper specimen collection/handling, submission of specimen other than nasopharyngeal swab, presence of viral mutation(s) within the areas targeted by this assay, and inadequate number of viral copies (<131 copies/mL). A negative result must be combined with clinical observations, patient history, and epidemiological information. The expected result is Negative.  Fact Sheet for Patients:  PinkCheek.be  Fact Sheet for Healthcare Providers:  GravelBags.it  This test is no t yet approved or cleared by the [place] FDA and  has been authorized for detection and/or diagnosis of SARS-CoV-2 by FDA under an Emergency Use Authorization (EUA). This EUA will remain  in effect (meaning this test can be used) for the duration of the COVID-19 declaration under Section 564(b)(1) of the Act, 21 U.S.C. section 360bbb-3(b)(1), unless the authorization is terminated or revoked sooner.     Influenza A by PCR NEGATIVE NEGATIVE Final   Influenza B by PCR NEGATIVE NEGATIVE Final    Comment: (NOTE) The Xpert Xpress SARS-CoV-2/FLU/RSV assay is intended as an aid in  the diagnosis of influenza from Nasopharyngeal swab specimens and  should not be used as a sole basis for treatment. Nasal washings and  aspirates are unacceptable for Xpert Xpress SARS-CoV-2/FLU/RSV  testing.  Fact Sheet for Patients: PinkCheek.be  Fact Sheet for Healthcare Providers: GravelBags.it  This test is not yet approved or cleared by the [place] FDA and  has been authorized for detection and/or diagnosis of SARS-CoV-2 by  FDA under an Emergency Use Authorization (EUA). This EUA will remain  in effect (meaning this test can be used) for the duration of the   Covid-19 declaration under Section 564(b)(1) of the Act, 21  U.S.C. section 360bbb-3(b)(1), unless the authorization is  terminated or revoked. Performed at [facility], [address] [place], [place] [address]      Labs: BNP (last 3 results) Recent Labs    06/10/20 1101 06/10/20 2226  BNP 687.0* 716.9*   Basic Metabolic Panel: Recent Labs  Lab 06/10/20 1101 06/11/20 0534 06/12/20 0947  NA 135 131* 130*  K 4.2 3.3* 3.5  CL 103 99 96*  CO2 23 22 26   GLUCOSE 104* 105* 119*  BUN 22 27* 26*  CREATININE 1.03 1.07 1.06  CALCIUM 8.5* 8.1* 8.3*   Liver Function Tests: Recent Labs  Lab 06/10/20 1101  AST 15  ALT 13  ALKPHOS 50  BILITOT 1.8*  PROT 6.1*  ALBUMIN 3.4*   No results for input(s): LIPASE, AMYLASE in the last 168 hours. No results for input(s): AMMONIA in the last 168 hours. CBC: Recent Labs  Lab 06/10/20 1101 06/11/20 0534 06/12/20 0947  WBC 9.1 9.8 7.9  NEUTROABS 7.7  --   --   HGB 7.0* 7.6* 8.3*  HCT 23.5* 23.9* 25.8*  MCV 66.8* 65.7* 65.6*  PLT 285 255 298   Cardiac Enzymes: No results for input(s): CKTOTAL, CKMB, CKMBINDEX, TROPONINI in the last 168 hours. BNP: Invalid input(s): POCBNP CBG: No results for input(s): GLUCAP in the last 168 hours. D-Dimer No results for input(s): DDIMER in the last 72 hours. Hgb A1c  No results for input(s): HGBA1C in the last 72 hours. Lipid Profile No results for input(s): CHOL, HDL, LDLCALC, TRIG, CHOLHDL, LDLDIRECT in the last 72 hours. Thyroid function studies No results for input(s): TSH, T4TOTAL, T3FREE, THYROIDAB in the last 72 hours.  Invalid input(s): FREET3 Anemia work up Recent Labs    06/10/20 1304  VITAMINB12 288  FOLATE 8.3  FERRITIN 14*  TIBC 412  IRON 14*  RETICCTPCT 1.1   Urinalysis    Component Value Date/Time   COLORURINE AMBER (A) 08/15/2018 1830   APPEARANCEUR HAZY (A) 08/15/2018 1830   LABSPEC 1.023 08/15/2018 1830   PHURINE 5.0 08/15/2018 1830   GLUCOSEU NEGATIVE  08/15/2018 1830   HGBUR NEGATIVE 08/15/2018 1830   BILIRUBINUR NEGATIVE 08/15/2018 1830   KETONESUR NEGATIVE 08/15/2018 1830   PROTEINUR NEGATIVE 08/15/2018 1830   UROBILINOGEN 0.2 05/28/2013 1125   NITRITE NEGATIVE 08/15/2018 1830   LEUKOCYTESUR TRACE (A) 08/15/2018 1830   Sepsis Labs Invalid input(s): PROCALCITONIN,  WBC,  LACTICIDVEN Microbiology Recent Results (from the past 240 hour(s))  Respiratory Panel by RT PCR (Flu A&B, Covid) - Nasopharyngeal Swab     Status: None   Collection Time: 06/10/20 11:26 AM   Specimen: Nasopharyngeal Swab  Result Value Ref Range Status   SARS Coronavirus 2 by RT PCR NEGATIVE NEGATIVE Final    Comment: (NOTE) SARS-CoV-2 target nucleic acids are NOT DETECTED.  The SARS-CoV-2 RNA is generally detectable in upper respiratoy specimens during the acute phase of infection. The lowest concentration of SARS-CoV-2 viral copies this assay can detect is 131 copies/mL. A negative result does not preclude SARS-Cov-2 infection and should not be used as the sole basis for treatment or other patient management decisions. A negative result may occur with  improper specimen collection/handling, submission of specimen other than nasopharyngeal swab, presence of viral mutation(s) within the areas targeted by this assay, and inadequate number of viral copies (<131 copies/mL). A negative result must be combined with clinical observations, patient history, and epidemiological information. The expected result is Negative.  Fact Sheet for Patients:  PinkCheek.be  Fact Sheet for Healthcare Providers:  GravelBags.it  This test is no t yet approved or cleared by the [place] FDA and  has been authorized for detection and/or diagnosis of SARS-CoV-2 by FDA under an Emergency Use Authorization (EUA). This EUA will remain  in effect (meaning this test can be used) for the duration of the COVID-19  declaration under Section 564(b)(1) of the Act, 21 U.S.C. section 360bbb-3(b)(1), unless the authorization is terminated or revoked sooner.     Influenza A by PCR NEGATIVE NEGATIVE Final   Influenza B by PCR NEGATIVE NEGATIVE Final    Comment: (NOTE) The Xpert Xpress SARS-CoV-2/FLU/RSV assay is intended as an aid in  the diagnosis of influenza from Nasopharyngeal swab specimens and  should not be used as a sole basis for treatment. Nasal washings and  aspirates are unacceptable for Xpert Xpress SARS-CoV-2/FLU/RSV  testing.  Fact Sheet for Patients: PinkCheek.be  Fact Sheet for Healthcare Providers: GravelBags.it  This test is not yet approved or cleared by the [place] FDA and  has been authorized for detection and/or diagnosis of SARS-CoV-2 by  FDA under an Emergency Use Authorization (EUA). This EUA will remain  in effect (meaning this test can be used) for the duration of the  Covid-19 declaration under Section 564(b)(1) of the Act, 21  U.S.C. section 360bbb-3(b)(1), unless the authorization is  terminated or revoked. Performed at [facility], [place]  [address] [place], [place] [address]      Time coordinating discharge: 48mins  SIGNED:   Kathie Dike, MD  [facility] 06/12/2020, 8:43 PM   If 7PM-7AM, please contact night-coverage [email or web address]

## 2020-06-12 NOTE — TOC Transition Note (Signed)
Transition of Care [facility]) - CM/SW Discharge Note   Patient Details  Name: Drew Reed MRN: [id number] Date of Birth: 01-03-1920  [facility]) CM/SW Contact:  Natasha Bence, LCSW Phone Number: 06/12/2020, 3:38 PM   Clinical Narrative:    CSW notified of patient's readiness for discharge. CSW contacted [facility] for HHPT. [facility] agreeable to begin services upon discharge. TOC signing off.    Final next level of care: Home w Home Health Services Barriers to Discharge: Barriers Resolved   Patient Goals and CMS Choice Patient states their goals for this hospitalization and ongoing recovery are:: return home, defers to wife in regards to home health CMS Medicare.gov Compare Post Acute Care list provided to:: Patient (and wife) Choice offered to / list presented to : Patient  Discharge Placement                  Name of family member notified: Marliss Czar Patient and family notified of of transfer: 06/12/20  Discharge Plan and Services   Discharge Planning Services: CM Consult Post Acute Care Choice: Home Health                    HH Arranged: RN, PT [facility] Agency: [place] ([place]) Date [facility] Agency Contacted: 06/12/20 Time [place]: [phone] Representative spoke with at [place]: [place] ([place]) Interventions     Readmission Risk Interventions No flowsheet data found.

## 2020-06-12 NOTE — Progress Notes (Signed)
Physical Therapy Treatment Patient Details Name: Drew Reed MRN: [id number] DOB: [birth year] Today's Date: 06/12/2020    History of Present Illness Drew Reed is a 86 y.o. male with medical history significant of hypertension, chronic systolic congestive heart failure, hypothyroidism, presents to the hospital after having a fall.  Patient had a fall yesterday where he describes his left leg giving out.  Denies any dizziness, head trauma or loss consciousness.  He had difficulty getting up after the fall.  Complains of pain in his left hip, left side chest.  He did not have any vomiting, diarrhea, abdominal pain.  He has not noticed any melena or hematochezia.  He has had no fever cough.  He does report shortness of breath on exertion.  His wife had noted that he had developing edema in his lower extremities over the last several days.    PT Comments    Patient sitting up in bed at start of session and very agreeable to therapy. Able to demonstrate all bed mobilities, transfers and ambulation with modified independence. Used rolling walker with walking and patient very steady with no need of assist. Initially walked 30 feet and then walked an additional 100 feet. Educated patient on use of walker at home for safety. Patient SPO2 was 90-97% on room air with rest and activity so oxygen was taken off the patient and nursing notified of O2 status and functional status. Patient eager to go home to dogs and demonstrated no change in functional status.  Current recommendation of home with home health appropriate at this time.  Patient will continue to benefit from skilled physical therapy in hospital and recommended venue below to increase strength, balance, endurance for safe ADLs and gait.   Follow Up Recommendations  Home health PT;Supervision/Assistance - 24 hour;Supervision for mobility/OOB     Equipment Recommendations  None recommended by PT    Recommendations for Other Services        Precautions / Restrictions      Mobility  Bed Mobility Overal bed mobility: Modified Independent Bed Mobility: Supine to Sit;Sit to Supine     Supine to sit: Modified independent (Device/Increase time) Sit to supine: Modified independent (Device/Increase time)   General bed mobility comments: increased time for transition but no assist needed  Transfers Overall transfer level: Needs assistance Equipment used: Rolling walker (2 wheeled) Transfers: Sit to/from Stand Sit to Stand: Modified independent (Device/Increase time)         General transfer comment: good standing balance upon standing  Ambulation/Gait Ambulation/Gait assistance: Min guard Gait Distance (Feet): 100 Feet (walked 30 feet, forgot mask, then walked additional 100 feet) Assistive device: Rolling walker (2 wheeled) Gait Pattern/deviations: Decreased step length - right;Decreased step length - left;Decreased stride length Gait velocity: decreased   General Gait Details: decreased gait speed but able to ambulate with modified independence   Stairs             Wheelchair Mobility    Modified Rankin (Stroke Patients Only)       Balance Overall balance assessment: Independent Sitting-balance support: No upper extremity supported;Feet unsupported   Sitting balance - Comments: paitent incontinent with inital standing, patient able to sit with legs up and leaning back with no upper extremity support while floor was cleaned   Standing balance support: No upper extremity supported Standing balance-Leahy Scale: Good  Cognition Arousal/Alertness: Awake/alert Behavior During Therapy: WFL for tasks assessed/performed Overall Cognitive Status: Within Functional Limits for tasks assessed                                        Exercises      General Comments        Pertinent Vitals/Pain      Home Living                       Prior Function            PT Goals (current goals can now be found in the care plan section) Acute Rehab PT Goals Patient Stated Goal: Return home PT Goal Formulation: With patient Time For Goal Achievement: 06/25/20 Potential to Achieve Goals: Good Progress towards PT goals: Progressing toward goals    Frequency    Min 3X/week      PT Plan Current plan remains appropriate    Co-evaluation              AM-PAC PT "6 Clicks" Mobility   Outcome Measure  Help needed turning from your back to your side while in a flat bed without using bedrails?: None Help needed moving from lying on your back to sitting on the side of a flat bed without using bedrails?: None Help needed moving to and from a bed to a chair (including a wheelchair)?: None Help needed standing up from a chair using your arms (e.g., wheelchair or bedside chair)?: None Help needed to walk in hospital room?: A Little Help needed climbing 3-5 steps with a railing? : A Little 6 Click Score: 22    End of Session Equipment Utilized During Treatment: Gait belt Activity Tolerance: Patient tolerated treatment well Patient left: in chair;with call bell/phone within reach Nurse Communication: Mobility status;Other (comment) (of placement in chair without chair alarm set) PT Visit Diagnosis: Unsteadiness on feet (R26.81);Other abnormalities of gait and mobility (R26.89);History of falling (Z91.81);Muscle weakness (generalized) (M62.81)     Time: 9528-4132 PT Time Calculation (min) (ACUTE ONLY): 40 min  Charges:  $Gait Training: 23-37 mins $Therapeutic Activity: 8-22 mins                    12:26 PM, 06/12/20 Jerene Pitch, DPT Physical Therapy with [facility]  [phone] office

## 2020-06-13 NOTE — Discharge Summary (Signed)
Physician Discharge Summary  Drew Reed IFO:[id number] DOB: 12/08/1919 DOA: 06/10/2020  PCP: [facility]  Admit date: 06/10/2020 Discharge date: 06/13/2020  Admitted From: Home Disposition: Home  Recommendations for Outpatient Follow-up:  1. Follow up with PCP in 1-2 weeks 2. Please obtain BMP/CBC in one week  Home Health: Home health PT Equipment/Devices:  Discharge Condition: Stable CODE STATUS: DNR Diet recommendation: Heart healthy  Brief/Interim Summary: Drew Reed a 86 y.o.malewith medical history significant ofhypertension, chronic systolic congestive heart failure, hypothyroidism, presents to the hospital after having a fall. Patient had a fall yesterday where he describes his left leg giving out. Denies any dizziness, head trauma or loss consciousness. He had difficulty getting up after the fall. Complains of pain in his left hip, left side chest. He did not have any vomiting, diarrhea, abdominal pain. He has not noticed any melena or hematochezia. He has had no fever cough. He does report shortness of breath on exertion. His wife had noted that he had developing edema in his lower extremities over the last several days.  Discharge Diagnoses:  Active Problems:   Hypothyroidism   Symptomatic anemia   Occult blood positive stool   Iron deficiency anemia due to chronic blood loss   Acute on chronic systolic CHF (congestive heart failure) (HCC)   DNR (do not resuscitate)   Fall at home, initial encounter   Multiple fractures of ribs, left side, initial encounter for closed fracture   Left hip pain  Symptomatic iron deficiency anemia, likely related to chronic blood loss from GI tract. -He was transfused 1 unit of PRBC on 10/14 -Follow-up hemoglobin improved to 8.3 -He also received iron infusion -Start on oral iron replacement therapy on discharge  Heme positive stools -No evidence of gross blood per patient CT of the abdomen and  pelvis did not show any obvious colon mass, although there was some thickening in right colon -With advanced age, would not pursue further work-up including colonoscopy -Patient and wife are in agreement  BPH -Continue on Flomax and finasteride -Bladder scan did not show significant postvoid residual -He has been able to pass his urine  Hypothyroidism. -Continue Synthroid  Left-sided rib fractures -Continue pain management  Acute on chronic diastolic congestive heart failure. -Echocardiogram shows ejection fraction of 55% -BNP elevated in the 600 range -Diuresed with IV Lasix with improvement of volume status -He has been transitioned to oral Lasix  Recurrent falls -Seen by physical therapy with recommendations for home health PT  Discharge Instructions  Discharge Instructions    Diet - low sodium heart healthy   Complete by: As directed    Increase activity slowly   Complete by: As directed      Allergies as of 06/12/2020   No Known Allergies     Medication List    STOP taking these medications   aspirin EC 81 MG tablet     TAKE these medications   acetaminophen 325 MG tablet Commonly known as: TYLENOL Take 1 tablet (325 mg total) by mouth every 4 (four) hours as needed for pain or fever.   carboxymethylcellulose 0.5 % Soln Commonly known as: REFRESH PLUS Apply 1 drop to eye 3 (three) times daily as needed.   cetirizine 10 MG tablet Commonly known as: ZYRTEC Take 10 mg by mouth daily as needed for allergies.   cholecalciferol 25 MCG (1000 UNIT) tablet Commonly known as: VITAMIN D3 Take 1,000 Units by mouth daily.   docusate sodium 100 MG capsule Commonly known  as: COLACE Take 100 mg by mouth 2 (two) times daily.   ferrous sulfate 325 (65 FE) MG EC tablet Take 1 tablet (325 mg total) by mouth 2 (two) times daily.   finasteride 5 MG tablet Commonly known as: PROSCAR Take 5 mg by mouth daily.   furosemide 40 MG tablet Commonly known as:  Lasix Take 1 tablet (40 mg total) by mouth daily.   guaifenesin 400 MG Tabs tablet Commonly known as: HUMIBID E Take 400 mg by mouth daily as needed (for congestion).   levothyroxine 25 MCG tablet Commonly known as: SYNTHROID Take 25 mcg by mouth daily before breakfast.   magnesium oxide 400 MG tablet Commonly known as: MAG-OX Take 1 tablet (400 mg total) by mouth daily.   Potassium Chloride ER 20 MEQ Tbcr Take 20 mEq by mouth daily.   tamsulosin 0.4 MG Caps capsule Commonly known as: FLOMAX Take 0.4 mg by mouth daily.       No Known Allergies  Consultations:     Procedures/Studies: DG Chest 2 View  Result Date: 06/10/2020 CLINICAL DATA:  Pain following fall EXAM: CHEST - 2 VIEW COMPARISON:  May 29, 2013 FINDINGS: There is a minimal left pleural effusion. There is scarring in the apices. There is a 1.2 x 1.0 cm nodular opacity overlying the heart anteriorly seen only on the lateral view. There is no edema or airspace opacity. Heart is mildly enlarged with pulmonary vascularity within normal limits. There is aortic atherosclerosis. No adenopathy. No pneumothorax. No bone lesions. IMPRESSION: Nodular opacity measuring 1.2 x 1.0 cm seen anteriorly on the lateral view overlying the heart. This opacity is not seen on frontal view. It may be prudent to consider noncontrast enhanced chest CT to further evaluate and localize this nodular opacity. Minimal left pleural effusion. Apical regions scarring. No edema or airspace opacity. Mild cardiac enlargement. No pneumothorax. Aortic Atherosclerosis (ICD10-I70.0). Electronically Signed   By: Lowella Grip III M.D.   On: 06/10/2020 10:24   CT Head Wo Contrast  Result Date: 06/10/2020 CLINICAL DATA:  Pain following fall EXAM: CT HEAD WITHOUT CONTRAST TECHNIQUE: Contiguous axial images were obtained from the base of the skull through the vertex without intravenous contrast. COMPARISON:  None. FINDINGS: Brain: There is mild to  moderate diffuse atrophy. There is no intracranial mass, hemorrhage, extra-axial fluid collection, or midline shift. There is patchy small vessel disease in the centra semiovale bilaterally. No evident acute infarct. Vascular: No hyperdense vessel. There is calcification in the distal vertebral arteries and carotid siphon regions bilaterally. Skull: Bony calvarium appears intact. Sinuses/Orbits: There is mucosal thickening in several ethmoid air cells. Orbits appear symmetric bilaterally. Evidence of previous scleral banding. Other: Mastoid air cells are clear on the left. Opacification is noted of somewhat hypoplastic mastoids on the right. IMPRESSION: Atrophy with periventricular small vessel disease. No acute infarct evident. No mass or hemorrhage. Foci of arterial vascular calcification at several levels. Mucosal thickening noted in several ethmoid air cells. Opacification of hypoplastic mastoids on the right noted. No appreciable air-fluid levels. Electronically Signed   By: Lowella Grip III M.D.   On: 06/10/2020 10:26   CT Angio Chest PE W and/or Wo Contrast  Result Date: 06/10/2020 CLINICAL DATA:  Chest pain after fall.  Abnormal chest x-ray EXAM: CT ANGIOGRAPHY CHEST WITH CONTRAST TECHNIQUE: Multidetector CT imaging of the chest was performed using the standard protocol during bolus administration of intravenous contrast. Multiplanar CT image reconstructions and MIPs were obtained to evaluate the vascular anatomy. CONTRAST:  12mL OMNIPAQUE IOHEXOL 350 MG/ML SOLN COMPARISON:  Chest x-ray 06/10/2020 FINDINGS: Cardiovascular: Satisfactory opacification of the pulmonary arteries to the segmental level. No evidence of pulmonary embolism. Ascending thoracic aorta measures 4.0 cm in diameter. There are atherosclerotic calcifications of the aorta and coronary arteries. Cardiomegaly. No pericardial effusion. Mediastinum/Nodes: No enlarged mediastinal, hilar, or axillary lymph nodes. Thyroid gland, trachea,  and esophagus demonstrate no significant findings. Lungs/Pleura: Biapical pleuroparenchymal scarring. 6 mm subpleural nodule within the medial aspect of the right upper lobe (series 6, image 79). 6 mm subpleural nodule within the periphery of the right lower lobe (series 6, image 142). Mild bibasilar atelectasis. No focal airspace consolidation. Trace bilateral pleural effusions. No pneumothorax Upper Abdomen: No acute abnormality. Musculoskeletal: Exaggerated thoracic kyphosis. Vertebral body heights are maintained without evidence of fracture. Scattered endplate Schmorl's nodes. Mildly displaced fracture of the posterior left ninth rib (series 4, image 54). Review of the MIP images confirms the above findings. IMPRESSION: 1. Negative for pulmonary embolism. 2. Mildly displaced fracture of the posterior left ninth rib. No pneumothorax. 3. There are 2 right-sided pulmonary nodules both measuring 6 mm. Non-contrast chest CT at 3-6 months is recommended. If the nodules are stable at time of repeat CT, then future CT at 18-24 months (from today's scan) is considered optional for low-risk patients, but is recommended for high-risk patients. This recommendation follows the consensus statement: Guidelines for Management of Incidental Pulmonary Nodules Detected on CT Images: From the Fleischner Society 2017; Radiology 2017; 284:228-243. 4. Cardiomegaly with coronary artery and aortic atherosclerosis. (ICD10-I70.0). Electronically Signed   By: Davina Poke D.O.   On: 06/10/2020 14:03   CT Cervical Spine Wo Contrast  Result Date: 06/10/2020 CLINICAL DATA:  Falls EXAM: CT CERVICAL SPINE WITHOUT CONTRAST TECHNIQUE: Multidetector CT imaging of the cervical spine was performed without intravenous contrast. Multiplanar CT image reconstructions were also generated. COMPARISON:  None. FINDINGS: Alignment: No significant listhesis. Skull base and vertebrae: No acute cervical spine fracture. Vertebral body heights are  maintained apart from degenerative endplate irregularity. Soft tissues and spinal canal: No prevertebral fluid or swelling. No visible canal hematoma. Disc levels: Multilevel degenerative changes are present including disc space narrowing, endplate osteophytes, and facet and uncovertebral hypertrophy. There is no high-grade osseous encroachment on the spinal canal. Upper chest: Scarring at the lung apices. Other: Calcified plaque at the common carotid bifurcations. IMPRESSION: No acute cervical spine fracture. Electronically Signed   By: Macy Mis M.D.   On: 06/10/2020 10:19   CT ABDOMEN PELVIS W CONTRAST  Result Date: 06/11/2020 CLINICAL DATA:  Colorectal cancer staging. Reported recent outside CT demonstrating focal wall thickening and luminal narrowing of the ascending colon. Iron deficiency anemia. EXAM: CT ABDOMEN AND PELVIS WITH CONTRAST TECHNIQUE: Multidetector CT imaging of the abdomen and pelvis was performed using the standard protocol following bolus administration of intravenous contrast. CONTRAST:  96mL OMNIPAQUE IOHEXOL 300 MG/ML  SOLN COMPARISON:  Abdominopelvic CT 05/28/2013. The referenced more recent outside study is not available. FINDINGS: Lower chest: Stable cardiomegaly. There are small bilateral pleural effusions with mild dependent bibasilar atelectasis. No suspicious pulmonary nodularity. Hepatobiliary: The liver is normal in density without suspicious focal abnormality. The gallbladder is incompletely distended. No evidence of gallstones, gallbladder wall thickening or biliary dilatation. Pancreas: Diffusely atrophied. No evidence of pancreatic mass, ductal dilatation or surrounding inflammation. Spleen: Calcified granulomas.  Otherwise unremarkable in appearance. Adrenals/Urinary Tract: Stable 15 mm right adrenal nodule on image 15/3, consistent with an adenoma. The left adrenal gland appears normal. There  is a nonobstructing 3 mm calculus in the upper pole of the right kidney.  There are small cysts in the upper pole of the left kidney. No evidence of hydronephrosis or ureteral calculus. There is a 5 mm bladder calculus on image 64/3. The bladder is mildly distended with intermediate density fluid, possibly from hematuria. No apparent bladder wall thickening or surrounding inflammation. Stomach/Bowel: Enteric contrast was administered and has passed into the distal small bowel. The stomach appears normal. There is no small bowel distension, wall thickening or surrounding inflammation. Small bowel extends into a left inguinal hernia. There is no evidence of incarceration or obstruction. The enteric contrast has not yet passed through the terminal ileum or the colon. The cecum is fluid-filled with possible mild wall thickening but no gross mass lesion. Mild sigmoid diverticulosis. No evidence of colonic obstruction or perforation. Vascular/Lymphatic: There are no enlarged abdominal or pelvic lymph nodes. Aortic and branch vessel atherosclerosis. No acute vascular findings. The portal, superior mesenteric and splenic veins are patent. Reproductive: Moderate enlargement of the prostate gland, asymmetric to the right, grossly similar from previous study. Other: Left inguinal hernia has mildly enlarged compared with the prior CT. This contains small bowel and fluid. Previously, the sigmoid colon extended into this hernia. No evidence of incarceration. Stable umbilical hernia containing fat. No ascites or peritoneal nodularity. Musculoskeletal: No acute or significant osseous findings. Mild lumbar spondylosis for age with a mild convex right scoliosis. IMPRESSION: 1. No evidence of metastatic disease in the abdomen or pelvis. Fluid-filled cecum with possible mild wall thickening, but no obvious colonic mass. 2. Mildly enlarged left inguinal hernia containing small bowel and fluid. Previously, the sigmoid colon extended into this hernia. No evidence of incarceration or obstruction. 3.  Nonobstructing right renal and bladder calculi. Mildly distended bladder with intermediate density fluid, possibly from hematuria. 4. Stable right adrenal adenoma. 5. Aortic Atherosclerosis (ICD10-I70.0). Electronically Signed   By: Richardean Sale M.D.   On: 06/11/2020 12:28   DG Shoulder Left  Result Date: 06/10/2020 CLINICAL DATA:  Pain following fall EXAM: LEFT SHOULDER - 2+ VIEW COMPARISON:  None. FINDINGS: Frontal and Y scapular images were obtained. No fracture or dislocation. There is mild generalized joint space narrowing. No erosive change or intra-articular calcification. Visualized left lung clear. There is aortic atherosclerosis. IMPRESSION: Mild generalized joint space narrowing.  No fracture or dislocation. Aortic Atherosclerosis (ICD10-I70.0). Electronically Signed   By: Lowella Grip III M.D.   On: 06/10/2020 10:28   ECHOCARDIOGRAM COMPLETE  Result Date: 06/11/2020    ECHOCARDIOGRAM REPORT   Patient Name:   JASHAWN FLOYD Date of Exam: 06/11/2020 Medical Rec #:  [id number]       Height:       75.0 in Accession #:    [id number]      Weight:       171.1 lb Date of Birth:  12-21-1919       BSA:          2.053 m Patient Age:    35 years        BP:           111/57 mmHg Patient Gender: M               HR:           65 bpm. Exam Location:  [facility] Procedure: 2D Echo, Cardiac Doppler and Color Doppler Indications:    I50.23 Acute on chronic systolic (congestive) heart failure  History:  Patient has prior history of Echocardiogram examinations, most                 recent 05/29/2013. Risk Factors:Hypertension. Thyroid Disease.  Sonographer:    Jonelle Sidle Dance Referring Phys: [place]  1. Left ventricular ejection fraction, by estimation, is 55%. The left ventricle has normal function. The left ventricle has no regional wall motion abnormalities. The left ventricular internal cavity size was mildly dilated. There is mild left ventricular hypertrophy. Left  ventricular diastolic parameters are indeterminate.  2. Right ventricular systolic function is mildly reduced. The right ventricular size is normal. There is mildly elevated pulmonary artery systolic pressure.  3. Left atrial size was severely dilated.  4. Right atrial size was severely dilated.  5. Mild mitral valve regurgitation.  6. Tricuspid valve regurgitation is mild to moderate.  7. Aortic valve regurgitation is mild. Mild aortic valve sclerosis is present, with no evidence of aortic valve stenosis.  8. Aortic dilatation noted. There is mild dilatation of the aortic root, measuring 43 mm. There is mild dilatation of the ascending aorta, measuring 41 mm.  9. The inferior vena cava is normal in size with greater than 50% respiratory variability, suggesting right atrial pressure of 3 mmHg. FINDINGS  Left Ventricle: Left ventricular ejection fraction, by estimation, is 55%. The left ventricle has normal function. The left ventricle has no regional wall motion abnormalities. The left ventricular internal cavity size was mildly dilated. There is mild left ventricular hypertrophy. Left ventricular diastolic parameters are indeterminate. Right Ventricle: The right ventricular size is normal. Right vetricular wall thickness was not assessed. Right ventricular systolic function is mildly reduced. There is mildly elevated pulmonary artery systolic pressure. The tricuspid regurgitant velocity is 2.81 m/s, and with an assumed right atrial pressure of 8 mmHg, the estimated right ventricular systolic pressure is 88.5 mmHg. Left Atrium: Left atrial size was severely dilated. Right Atrium: Right atrial size was severely dilated. Pericardium: There is no evidence of pericardial effusion. Mitral Valve: The mitral valve is normal in structure. Mild mitral valve regurgitation. Tricuspid Valve: The tricuspid valve is normal in structure. Tricuspid valve regurgitation is mild to moderate. Aortic Valve: The aortic valve is abnormal.  Aortic valve regurgitation is mild. Mild aortic valve sclerosis is present, with no evidence of aortic valve stenosis. Pulmonic Valve: The pulmonic valve was normal in structure. Pulmonic valve regurgitation is mild. Aorta: Aortic dilatation noted. There is mild dilatation of the aortic root, measuring 43 mm. There is mild dilatation of the ascending aorta, measuring 41 mm. Venous: The inferior vena cava is normal in size with greater than 50% respiratory variability, suggesting right atrial pressure of 3 mmHg. IAS/Shunts: No atrial level shunt detected by color flow Doppler.  LEFT VENTRICLE PLAX 2D LVIDd:         5.67 cm LVIDs:         3.66 cm LV PW:         1.41 cm LV IVS:        1.45 cm LVOT diam:     2.30 cm LV SV:         72 LV SV Index:   35 LVOT Area:     4.15 cm  RIGHT VENTRICLE RV Basal diam:  3.74 cm RV Mid diam:    1.74 cm TAPSE (M-mode): 1.9 cm LEFT ATRIUM              Index        RIGHT ATRIUM  Index LA diam:        5.60 cm  2.73 cm/m   RA Area:     41.00 cm LA Vol (A2C):   246.0 ml 119.80 ml/m RA Volume:   158.00 ml 76.94 ml/m LA Vol (A4C):   245.0 ml 119.31 ml/m LA Biplane Vol: 247.0 ml 120.29 ml/m  AORTIC VALVE LVOT Vmax:   98.80 cm/s LVOT Vmean:  64.750 cm/s LVOT VTI:    0.173 m  AORTA Ao Root diam: 4.30 cm Ao Asc diam:  4.00 cm MITRAL VALVE                TRICUSPID VALVE MV Area (PHT): 4.13 cm     TR Peak grad:   31.6 mmHg MV Decel Time: 184 msec     TR Vmax:        281.00 cm/s MV E velocity: 106.25 cm/s                             SHUNTS                             Systemic VTI:  0.17 m                             Systemic Diam: 2.30 cm Dorris Carnes MD Electronically signed by Dorris Carnes MD Signature Date/Time: 06/11/2020/5:44:51 PM    Final    DG Hip Unilat W or Wo Pelvis 2-3 Views Left  Result Date: 06/10/2020 CLINICAL DATA:  Pain following fall EXAM: DG HIP (WITH OR WITHOUT PELVIS) 2-3V LEFT COMPARISON:  None. FINDINGS: Frontal pelvis as well as frontal and lateral left hip  images were obtained. There is no fracture or dislocation. There is slight symmetric narrowing of each hip joint. No erosive change. There is lower lumbar dextroscoliosis with degenerative change in the visualized lower lumbar region. There are foci of iliac artery atherosclerotic calcification bilaterally. IMPRESSION: Mild symmetric narrowing each hip joint. Degenerative change in lower lumbar spine with scoliosis. No fracture or dislocation. Electronically Signed   By: Lowella Grip III M.D.   On: 06/10/2020 10:27      Subjective: Feeling better.  No shortness of breath.  No chest pain.  Discharge Exam: Vitals:   06/11/20 2106 06/12/20 0430 06/12/20 0700 06/12/20 1412  BP: (!) 148/74 (!) 107/48  111/69  Pulse: 88 68  (!) 56  Resp:  16  20  Temp: 98.2 F (36.8 C) 98.1 F (36.7 C)  98.8 F (37.1 C)  TempSrc: Oral   Oral  SpO2: 95% 94%  96%  Weight:   75.3 kg   Height:        General: Pt is alert, awake, not in acute distress Cardiovascular: RRR, S1/S2 +, no rubs, no gallops Respiratory: CTA bilaterally, no wheezing, no rhonchi Abdominal: Soft, NT, ND, bowel sounds + Extremities: no edema, no cyanosis    The results of significant diagnostics from this hospitalization (including imaging, microbiology, ancillary and laboratory) are listed below for reference.     Microbiology: Recent Results (from the past 240 hour(s))  Respiratory Panel by RT PCR (Flu A&B, Covid) - Nasopharyngeal Swab     Status: None   Collection Time: 06/10/20 11:26 AM   Specimen: Nasopharyngeal Swab  Result Value Ref Range Status   SARS Coronavirus 2 by RT PCR NEGATIVE NEGATIVE Final  Comment: (NOTE) SARS-CoV-2 target nucleic acids are NOT DETECTED.  The SARS-CoV-2 RNA is generally detectable in upper respiratoy specimens during the acute phase of infection. The lowest concentration of SARS-CoV-2 viral copies this assay can detect is 131 copies/mL. A negative result does not preclude  SARS-Cov-2 infection and should not be used as the sole basis for treatment or other patient management decisions. A negative result may occur with  improper specimen collection/handling, submission of specimen other than nasopharyngeal swab, presence of viral mutation(s) within the areas targeted by this assay, and inadequate number of viral copies (<131 copies/mL). A negative result must be combined with clinical observations, patient history, and epidemiological information. The expected result is Negative.  Fact Sheet for Patients:  PinkCheek.be  Fact Sheet for Healthcare Providers:  GravelBags.it  This test is no t yet approved or cleared by the [place] FDA and  has been authorized for detection and/or diagnosis of SARS-CoV-2 by FDA under an Emergency Use Authorization (EUA). This EUA will remain  in effect (meaning this test can be used) for the duration of the COVID-19 declaration under Section 564(b)(1) of the Act, 21 U.S.C. section 360bbb-3(b)(1), unless the authorization is terminated or revoked sooner.     Influenza A by PCR NEGATIVE NEGATIVE Final   Influenza B by PCR NEGATIVE NEGATIVE Final    Comment: (NOTE) The Xpert Xpress SARS-CoV-2/FLU/RSV assay is intended as an aid in  the diagnosis of influenza from Nasopharyngeal swab specimens and  should not be used as a sole basis for treatment. Nasal washings and  aspirates are unacceptable for Xpert Xpress SARS-CoV-2/FLU/RSV  testing.  Fact Sheet for Patients: PinkCheek.be  Fact Sheet for Healthcare Providers: GravelBags.it  This test is not yet approved or cleared by the [place] FDA and  has been authorized for detection and/or diagnosis of SARS-CoV-2 by  FDA under an Emergency Use Authorization (EUA). This EUA will remain  in effect (meaning this test can be used) for the duration of the   Covid-19 declaration under Section 564(b)(1) of the Act, 21  U.S.C. section 360bbb-3(b)(1), unless the authorization is  terminated or revoked. Performed at [facility], [address] [place], [place] [address]      Labs: BNP (last 3 results) Recent Labs    06/10/20 1101 06/10/20 2226  BNP 687.0* 220.2*   Basic Metabolic Panel: Recent Labs  Lab 06/10/20 1101 06/11/20 0534 06/12/20 0947  NA 135 131* 130*  K 4.2 3.3* 3.5  CL 103 99 96*  CO2 23 22 26   GLUCOSE 104* 105* 119*  BUN 22 27* 26*  CREATININE 1.03 1.07 1.06  CALCIUM 8.5* 8.1* 8.3*   Liver Function Tests: Recent Labs  Lab 06/10/20 1101  AST 15  ALT 13  ALKPHOS 50  BILITOT 1.8*  PROT 6.1*  ALBUMIN 3.4*   No results for input(s): LIPASE, AMYLASE in the last 168 hours. No results for input(s): AMMONIA in the last 168 hours. CBC: Recent Labs  Lab 06/10/20 1101 06/11/20 0534 06/12/20 0947  WBC 9.1 9.8 7.9  NEUTROABS 7.7  --   --   HGB 7.0* 7.6* 8.3*  HCT 23.5* 23.9* 25.8*  MCV 66.8* 65.7* 65.6*  PLT 285 255 298   Cardiac Enzymes: No results for input(s): CKTOTAL, CKMB, CKMBINDEX, TROPONINI in the last 168 hours. BNP: Invalid input(s): POCBNP CBG: No results for input(s): GLUCAP in the last 168 hours. D-Dimer No results for input(s): DDIMER in the last 72 hours. Hgb A1c No results for input(s):  HGBA1C in the last 72 hours. Lipid Profile No results for input(s): CHOL, HDL, LDLCALC, TRIG, CHOLHDL, LDLDIRECT in the last 72 hours. Thyroid function studies No results for input(s): TSH, T4TOTAL, T3FREE, THYROIDAB in the last 72 hours.  Invalid input(s): FREET3 Anemia work up No results for input(s): VITAMINB12, FOLATE, FERRITIN, TIBC, IRON, RETICCTPCT in the last 72 hours. Urinalysis    Component Value Date/Time   COLORURINE AMBER (A) 08/15/2018 1830   APPEARANCEUR HAZY (A) 08/15/2018 1830   LABSPEC 1.023 08/15/2018 1830   PHURINE 5.0 08/15/2018 1830   GLUCOSEU NEGATIVE 08/15/2018 1830    HGBUR NEGATIVE 08/15/2018 1830   BILIRUBINUR NEGATIVE 08/15/2018 1830   KETONESUR NEGATIVE 08/15/2018 1830   PROTEINUR NEGATIVE 08/15/2018 1830   UROBILINOGEN 0.2 05/28/2013 1125   NITRITE NEGATIVE 08/15/2018 1830   LEUKOCYTESUR TRACE (A) 08/15/2018 1830   Sepsis Labs Invalid input(s): PROCALCITONIN,  WBC,  LACTICIDVEN Microbiology Recent Results (from the past 240 hour(s))  Respiratory Panel by RT PCR (Flu A&B, Covid) - Nasopharyngeal Swab     Status: None   Collection Time: 06/10/20 11:26 AM   Specimen: Nasopharyngeal Swab  Result Value Ref Range Status   SARS Coronavirus 2 by RT PCR NEGATIVE NEGATIVE Final    Comment: (NOTE) SARS-CoV-2 target nucleic acids are NOT DETECTED.  The SARS-CoV-2 RNA is generally detectable in upper respiratoy specimens during the acute phase of infection. The lowest concentration of SARS-CoV-2 viral copies this assay can detect is 131 copies/mL. A negative result does not preclude SARS-Cov-2 infection and should not be used as the sole basis for treatment or other patient management decisions. A negative result may occur with  improper specimen collection/handling, submission of specimen other than nasopharyngeal swab, presence of viral mutation(s) within the areas targeted by this assay, and inadequate number of viral copies (<131 copies/mL). A negative result must be combined with clinical observations, patient history, and epidemiological information. The expected result is Negative.  Fact Sheet for Patients:  PinkCheek.be  Fact Sheet for Healthcare Providers:  GravelBags.it  This test is no t yet approved or cleared by the [place] FDA and  has been authorized for detection and/or diagnosis of SARS-CoV-2 by FDA under an Emergency Use Authorization (EUA). This EUA will remain  in effect (meaning this test can be used) for the duration of the COVID-19 declaration under Section  564(b)(1) of the Act, 21 U.S.C. section 360bbb-3(b)(1), unless the authorization is terminated or revoked sooner.     Influenza A by PCR NEGATIVE NEGATIVE Final   Influenza B by PCR NEGATIVE NEGATIVE Final    Comment: (NOTE) The Xpert Xpress SARS-CoV-2/FLU/RSV assay is intended as an aid in  the diagnosis of influenza from Nasopharyngeal swab specimens and  should not be used as a sole basis for treatment. Nasal washings and  aspirates are unacceptable for Xpert Xpress SARS-CoV-2/FLU/RSV  testing.  Fact Sheet for Patients: PinkCheek.be  Fact Sheet for Healthcare Providers: GravelBags.it  This test is not yet approved or cleared by the [place] FDA and  has been authorized for detection and/or diagnosis of SARS-CoV-2 by  FDA under an Emergency Use Authorization (EUA). This EUA will remain  in effect (meaning this test can be used) for the duration of the  Covid-19 declaration under Section 564(b)(1) of the Act, 21  U.S.C. section 360bbb-3(b)(1), unless the authorization is  terminated or revoked. Performed at [facility], [address], [place], [place] [address]      Time coordinating discharge: 72mins  SIGNED:  Kathie Dike, MD  [facility] 06/13/2020, 9:32 PM   If 7PM-7AM, please contact night-coverage [email or web address]

## 2020-07-16 ENCOUNTER — Inpatient Hospital Stay (HOSPITAL_COMMUNITY)
Admission: EM | Admit: 2020-07-16 | Discharge: 2020-07-28 | DRG: 951 | Disposition: E | Payer: No Typology Code available for payment source | Attending: Family Medicine | Admitting: Family Medicine

## 2020-07-16 ENCOUNTER — Emergency Department (HOSPITAL_COMMUNITY): Payer: No Typology Code available for payment source

## 2020-07-16 ENCOUNTER — Other Ambulatory Visit: Payer: Self-pay

## 2020-07-16 ENCOUNTER — Encounter (HOSPITAL_COMMUNITY): Payer: Self-pay

## 2020-07-16 DIAGNOSIS — R198 Other specified symptoms and signs involving the digestive system and abdomen: Secondary | ICD-10-CM

## 2020-07-16 DIAGNOSIS — I5023 Acute on chronic systolic (congestive) heart failure: Secondary | ICD-10-CM | POA: Diagnosis present

## 2020-07-16 DIAGNOSIS — I5042 Chronic combined systolic (congestive) and diastolic (congestive) heart failure: Secondary | ICD-10-CM | POA: Diagnosis not present

## 2020-07-16 DIAGNOSIS — Z515 Encounter for palliative care: Secondary | ICD-10-CM | POA: Diagnosis not present

## 2020-07-16 DIAGNOSIS — I10 Essential (primary) hypertension: Secondary | ICD-10-CM | POA: Diagnosis present

## 2020-07-16 DIAGNOSIS — Z7189 Other specified counseling: Secondary | ICD-10-CM

## 2020-07-16 DIAGNOSIS — E871 Hypo-osmolality and hyponatremia: Secondary | ICD-10-CM | POA: Diagnosis present

## 2020-07-16 DIAGNOSIS — I959 Hypotension, unspecified: Secondary | ICD-10-CM | POA: Diagnosis not present

## 2020-07-16 DIAGNOSIS — E039 Hypothyroidism, unspecified: Secondary | ICD-10-CM | POA: Diagnosis present

## 2020-07-16 DIAGNOSIS — I4891 Unspecified atrial fibrillation: Secondary | ICD-10-CM | POA: Diagnosis present

## 2020-07-16 DIAGNOSIS — K631 Perforation of intestine (nontraumatic): Secondary | ICD-10-CM | POA: Diagnosis present

## 2020-07-16 DIAGNOSIS — R1030 Lower abdominal pain, unspecified: Secondary | ICD-10-CM | POA: Diagnosis not present

## 2020-07-16 DIAGNOSIS — Z79899 Other long term (current) drug therapy: Secondary | ICD-10-CM

## 2020-07-16 DIAGNOSIS — Z6820 Body mass index (BMI) 20.0-20.9, adult: Secondary | ICD-10-CM | POA: Diagnosis not present

## 2020-07-16 DIAGNOSIS — R6521 Severe sepsis with septic shock: Secondary | ICD-10-CM | POA: Diagnosis not present

## 2020-07-16 DIAGNOSIS — E872 Acidosis, unspecified: Secondary | ICD-10-CM

## 2020-07-16 DIAGNOSIS — I130 Hypertensive heart and chronic kidney disease with heart failure and stage 1 through stage 4 chronic kidney disease, or unspecified chronic kidney disease: Secondary | ICD-10-CM | POA: Diagnosis present

## 2020-07-16 DIAGNOSIS — R1084 Generalized abdominal pain: Secondary | ICD-10-CM | POA: Diagnosis not present

## 2020-07-16 DIAGNOSIS — D509 Iron deficiency anemia, unspecified: Secondary | ICD-10-CM | POA: Diagnosis present

## 2020-07-16 DIAGNOSIS — N184 Chronic kidney disease, stage 4 (severe): Secondary | ICD-10-CM | POA: Diagnosis present

## 2020-07-16 DIAGNOSIS — R64 Cachexia: Secondary | ICD-10-CM | POA: Diagnosis present

## 2020-07-16 DIAGNOSIS — N179 Acute kidney failure, unspecified: Secondary | ICD-10-CM | POA: Diagnosis present

## 2020-07-16 DIAGNOSIS — R0689 Other abnormalities of breathing: Secondary | ICD-10-CM | POA: Diagnosis not present

## 2020-07-16 DIAGNOSIS — Z20822 Contact with and (suspected) exposure to covid-19: Secondary | ICD-10-CM | POA: Diagnosis present

## 2020-07-16 DIAGNOSIS — K659 Peritonitis, unspecified: Secondary | ICD-10-CM | POA: Diagnosis present

## 2020-07-16 DIAGNOSIS — H9190 Unspecified hearing loss, unspecified ear: Secondary | ICD-10-CM | POA: Diagnosis present

## 2020-07-16 DIAGNOSIS — I480 Paroxysmal atrial fibrillation: Secondary | ICD-10-CM | POA: Diagnosis not present

## 2020-07-16 DIAGNOSIS — Z66 Do not resuscitate: Secondary | ICD-10-CM | POA: Diagnosis present

## 2020-07-16 DIAGNOSIS — R0902 Hypoxemia: Secondary | ICD-10-CM | POA: Diagnosis not present

## 2020-07-16 DIAGNOSIS — N1831 Chronic kidney disease, stage 3a: Secondary | ICD-10-CM | POA: Diagnosis present

## 2020-07-16 DIAGNOSIS — A419 Sepsis, unspecified organism: Secondary | ICD-10-CM | POA: Diagnosis present

## 2020-07-16 DIAGNOSIS — I5043 Acute on chronic combined systolic (congestive) and diastolic (congestive) heart failure: Secondary | ICD-10-CM | POA: Diagnosis present

## 2020-07-16 DIAGNOSIS — I5022 Chronic systolic (congestive) heart failure: Secondary | ICD-10-CM | POA: Diagnosis present

## 2020-07-16 DIAGNOSIS — N400 Enlarged prostate without lower urinary tract symptoms: Secondary | ICD-10-CM | POA: Diagnosis present

## 2020-07-16 LAB — CBC WITH DIFFERENTIAL/PLATELET
Band Neutrophils: 34 %
Basophils Absolute: 0 10*3/uL (ref 0.0–0.1)
Basophils Relative: 0 %
Eosinophils Absolute: 0 10*3/uL (ref 0.0–0.5)
Eosinophils Relative: 0 %
HCT: 43.6 % (ref 39.0–52.0)
Hemoglobin: 13.8 g/dL (ref 13.0–17.0)
Lymphocytes Relative: 3 %
Lymphs Abs: 0.6 10*3/uL — ABNORMAL LOW (ref 0.7–4.0)
MCH: 25.8 pg — ABNORMAL LOW (ref 26.0–34.0)
MCHC: 31.7 g/dL (ref 30.0–36.0)
MCV: 81.5 fL (ref 80.0–100.0)
Metamyelocytes Relative: 5 %
Monocytes Absolute: 0.6 10*3/uL (ref 0.1–1.0)
Monocytes Relative: 3 %
Neutro Abs: 16.9 10*3/uL — ABNORMAL HIGH (ref 1.7–7.7)
Neutrophils Relative %: 55 %
Platelets: 354 10*3/uL (ref 150–400)
RBC: 5.35 MIL/uL (ref 4.22–5.81)
WBC: 19 10*3/uL — ABNORMAL HIGH (ref 4.0–10.5)
nRBC: 0 % (ref 0.0–0.2)

## 2020-07-16 LAB — COMPREHENSIVE METABOLIC PANEL
ALT: 27 U/L (ref 0–44)
AST: 48 U/L — ABNORMAL HIGH (ref 15–41)
Albumin: 3.8 g/dL (ref 3.5–5.0)
Alkaline Phosphatase: 76 U/L (ref 38–126)
Anion gap: 16 — ABNORMAL HIGH (ref 5–15)
BUN: 32 mg/dL — ABNORMAL HIGH (ref 8–23)
CO2: 21 mmol/L — ABNORMAL LOW (ref 22–32)
Calcium: 8.8 mg/dL — ABNORMAL LOW (ref 8.9–10.3)
Chloride: 97 mmol/L — ABNORMAL LOW (ref 98–111)
Creatinine, Ser: 1.7 mg/dL — ABNORMAL HIGH (ref 0.61–1.24)
GFR, Estimated: 36 mL/min — ABNORMAL LOW (ref >60)
Glucose, Bld: 104 mg/dL — ABNORMAL HIGH (ref 70–99)
Potassium: 3.6 mmol/L (ref 3.5–5.1)
Sodium: 134 mmol/L — ABNORMAL LOW (ref 135–145)
Total Bilirubin: 2.4 mg/dL — ABNORMAL HIGH (ref 0.3–1.2)
Total Protein: 6.8 g/dL (ref 6.5–8.1)

## 2020-07-16 LAB — RESP PANEL BY RT-PCR (FLU A&B, COVID) ARPGX2
Influenza A by PCR: NEGATIVE
Influenza B by PCR: NEGATIVE
SARS Coronavirus 2 by RT PCR: NEGATIVE

## 2020-07-16 LAB — URINALYSIS, ROUTINE W REFLEX MICROSCOPIC
Bacteria, UA: NONE SEEN
Bilirubin Urine: NEGATIVE
Glucose, UA: NEGATIVE mg/dL
Hgb urine dipstick: NEGATIVE
Ketones, ur: 5 mg/dL — AB
Leukocytes,Ua: NEGATIVE
Nitrite: NEGATIVE
Protein, ur: 30 mg/dL — AB
Specific Gravity, Urine: 1.018 (ref 1.005–1.030)
pH: 5 (ref 5.0–8.0)

## 2020-07-16 LAB — LACTIC ACID, PLASMA
Lactic Acid, Venous: 6.8 mmol/L (ref 0.5–1.9)
Lactic Acid, Venous: 5.2 mmol/L (ref 0.5–1.9)

## 2020-07-16 LAB — PROTIME-INR
INR: 1.4 — ABNORMAL HIGH (ref 0.8–1.2)
Prothrombin Time: 16.6 seconds — ABNORMAL HIGH (ref 11.4–15.2)

## 2020-07-16 MED ORDER — SODIUM CHLORIDE 0.9 % IV BOLUS
1000.0000 mL | Freq: Once | INTRAVENOUS | Status: AC
  Administered 2020-07-16: 1000 mL via INTRAVENOUS

## 2020-07-16 MED ORDER — ONDANSETRON HCL 4 MG/2ML IJ SOLN: 4.0000 mg | Freq: Four times a day (QID) | INTRAMUSCULAR | Status: DC | PRN

## 2020-07-16 MED ORDER — ACETAMINOPHEN 325 MG PO TABS: 650.0000 mg | ORAL_TABLET | Freq: Four times a day (QID) | ORAL | Status: DC | PRN

## 2020-07-16 MED ORDER — LACTATED RINGERS IV SOLN: INTRAVENOUS | Status: DC

## 2020-07-16 MED ORDER — ACETAMINOPHEN 650 MG RE SUPP: 650.0000 mg | Freq: Four times a day (QID) | RECTAL | Status: DC | PRN

## 2020-07-16 MED ORDER — LACTATED RINGERS IV BOLUS
1000.0000 mL | Freq: Once | INTRAVENOUS | Status: AC
  Administered 2020-07-16: 1000 mL via INTRAVENOUS

## 2020-07-16 MED ORDER — HEPARIN SODIUM (PORCINE) 5000 UNIT/ML IJ SOLN: 5000.0000 [IU] | Freq: Three times a day (TID) | INTRAMUSCULAR | Status: DC

## 2020-07-16 MED ORDER — HYDROMORPHONE HCL 1 MG/ML IJ SOLN
1.0000 mg | INTRAMUSCULAR | Status: DC | PRN
  Administered 2020-07-16 – 2020-07-17 (×2): 1 mg via INTRAVENOUS
  Filled 2020-07-16 (×2): qty 1

## 2020-07-16 MED ORDER — PIPERACILLIN-TAZOBACTAM 3.375 G IVPB
3.3750 g | Freq: Three times a day (TID) | INTRAVENOUS | Status: DC
  Administered 2020-07-16 – 2020-07-18 (×6): 3.375 g via INTRAVENOUS
  Filled 2020-07-16 (×6): qty 50

## 2020-07-16 MED ORDER — VANCOMYCIN HCL IN DEXTROSE 1-5 GM/200ML-% IV SOLN
1000.0000 mg | Freq: Once | INTRAVENOUS | Status: AC
  Administered 2020-07-16: 1000 mg via INTRAVENOUS
  Filled 2020-07-16: qty 200

## 2020-07-16 MED ORDER — ONDANSETRON HCL 4 MG PO TABS: 4.0000 mg | ORAL_TABLET | Freq: Four times a day (QID) | ORAL | Status: DC | PRN

## 2020-07-16 MED ORDER — FINASTERIDE 5 MG PO TABS
5.0000 mg | ORAL_TABLET | Freq: Every day | ORAL | Status: DC
  Administered 2020-07-17 – 2020-07-18 (×2): 5 mg via ORAL
  Filled 2020-07-16 (×2): qty 1

## 2020-07-16 MED ORDER — PIPERACILLIN-TAZOBACTAM 3.375 G IVPB
3.3750 g | Freq: Once | INTRAVENOUS | Status: AC
  Administered 2020-07-16: 3.375 g via INTRAVENOUS
  Filled 2020-07-16: qty 50

## 2020-07-16 MED ORDER — TAMSULOSIN HCL 0.4 MG PO CAPS
0.4000 mg | ORAL_CAPSULE | Freq: Every day | ORAL | Status: DC
  Administered 2020-07-17 – 2020-07-18 (×2): 0.4 mg via ORAL
  Filled 2020-07-16 (×2): qty 1

## 2020-07-16 NOTE — Consult Note (Signed)
Reason for Consult: Peritonitis, perforated viscus Referring Physician: Dr. Efrain Sella Drew Reed is an 86 y.o. male.  HPI: Patient is a 86 year old white male who was brought in by EMS for complaints of abdominal pain and generalized weakness over the past 2 days.  He was noted to be hypotensive with a leukocytosis and elevated lactic acid level.  A CT scan of the abdomen reveals a perforated viscus with free air and fluid in the abdominal cavity.  Is suspected that the patient has had a perforation of the small or large bowel.  Patient is hard of hearing.  Any movement causes his abdominal pain to worsen.  Past Medical History:  Diagnosis Date  . Chronic systolic heart failure ([place])   . Hypertension   . Kidney infection    2010  . Thyroid disease     Past Surgical History:  Procedure Laterality Date  . CHOLECYSTECTOMY      History reviewed. No pertinent family history.  Social History:  reports that he has never smoked. He has never used smokeless tobacco. He reports that he does not drink alcohol and does not use drugs.  Allergies: No Known Allergies  Medications: I have reviewed the patient's current medications.  Results for orders placed or performed during the hospital encounter of 07/22/2020 (from the past 48 hour(s))  Urinalysis, Routine w reflex microscopic Urine, Clean Catch     Status: Abnormal   Collection Time: 06/28/2020  8:51 AM  Result Value Ref Range   Color, Urine AMBER (A) YELLOW    Comment: BIOCHEMICALS MAY BE AFFECTED BY COLOR   APPearance CLEAR CLEAR   Specific Gravity, Urine 1.018 1.005 - 1.030   pH 5.0 5.0 - 8.0   Glucose, UA NEGATIVE NEGATIVE mg/dL   Hgb urine dipstick NEGATIVE NEGATIVE   Bilirubin Urine NEGATIVE NEGATIVE   Ketones, ur 5 (A) NEGATIVE mg/dL   Protein, ur 30 (A) NEGATIVE mg/dL   Nitrite NEGATIVE NEGATIVE   Leukocytes,Ua NEGATIVE NEGATIVE   RBC / HPF 0-5 0 - 5 RBC/hpf   WBC, UA 0-5 0 - 5 WBC/hpf   Bacteria, UA NONE SEEN NONE SEEN    Uric Acid Crys, UA PRESENT     Comment: Performed at [facility], [address], [place], [place] [address]  Comprehensive metabolic panel     Status: Abnormal   Collection Time: 07/19/2020  9:06 AM  Result Value Ref Range   Sodium 134 (L) 135 - 145 mmol/L   Potassium 3.6 3.5 - 5.1 mmol/L   Chloride 97 (L) 98 - 111 mmol/L   CO2 21 (L) 22 - 32 mmol/L   Glucose, Bld 104 (H) 70 - 99 mg/dL    Comment: Glucose reference range applies only to samples taken after fasting for at least 8 hours.   BUN 32 (H) 8 - 23 mg/dL   Creatinine, Ser 1.70 (H) 0.61 - 1.24 mg/dL   Calcium 8.8 (L) 8.9 - 10.3 mg/dL   Total Protein 6.8 6.5 - 8.1 g/dL   Albumin 3.8 3.5 - 5.0 g/dL   AST 48 (H) 15 - 41 U/L   ALT 27 0 - 44 U/L    Comment: RESULTS CONFIRMED BY MANUAL DILUTION   Alkaline Phosphatase 76 38 - 126 U/L   Total Bilirubin 2.4 (H) 0.3 - 1.2 mg/dL   GFR, Estimated 36 (L) >60 mL/min    Comment: (NOTE) Calculated using the CKD-EPI Creatinine Equation (2021)    Anion gap 16 (H) 5 - 15  Comment: Performed at [facility], [address], [place], [place] [address]  Lactic acid, plasma     Status: Abnormal   Collection Time: 07/15/2020  9:06 AM  Result Value Ref Range   Lactic Acid, Venous 6.8 (HH) 0.5 - 1.9 mmol/L    Comment: CRITICAL RESULT CALLED TO, READ BACK BY AND VERIFIED WITH: CUGINO,M AT 9:25AM ON 07/17/2020 BY Derrill Memo Performed at [facility], [address] [place], [place] [address]   CBC with Differential     Status: Abnormal   Collection Time: 07/17/2020  9:06 AM  Result Value Ref Range   WBC 19.0 (H) 4.0 - 10.5 K/uL   RBC 5.35 4.22 - 5.81 MIL/uL   Hemoglobin 13.8 13.0 - 17.0 g/dL   HCT 43.6 39 - 52 %   MCV 81.5 80.0 - 100.0 fL   MCH 25.8 (L) 26.0 - 34.0 pg   MCHC 31.7 30.0 - 36.0 g/dL   RDW Not Measured 11.5 - 15.5 %   Platelets 354 150 - 400 K/uL   nRBC 0.0 0.0 - 0.2 %   Neutrophils Relative % 55 %   Neutro Abs 16.9 (H) 1.7 - 7.7 K/uL   Band Neutrophils 34 %   Lymphocytes  Relative 3 %   Lymphs Abs 0.6 (L) 0.7 - 4.0 K/uL   Monocytes Relative 3 %   Monocytes Absolute 0.6 0.1 - 1.0 K/uL   Eosinophils Relative 0 %   Eosinophils Absolute 0.0 0.0 - 0.5 K/uL   Basophils Relative 0 %   Basophils Absolute 0.0 0.0 - 0.1 K/uL   WBC Morphology DOHLE BODIES     Comment: INCREASED BANDS (>20% BANDS) MILD LEFT SHIFT (1-5% METAS, OCC MYELO, OCC BANDS) TOXIC GRANULATION VACUOLATED NEUTROPHILS    Metamyelocytes Relative 5 %    Comment: Performed at [facility], [address], [place], [place] [address]  Protime-INR     Status: Abnormal   Collection Time: 07/27/2020  9:06 AM  Result Value Ref Range   Prothrombin Time 16.6 (H) 11.4 - 15.2 seconds   INR 1.4 (H) 0.8 - 1.2    Comment: (NOTE) INR goal varies based on device and disease states. Performed at [facility], [address], [place], [place] [address]   Culture, blood (Routine x 2)     Status: None (Preliminary result)   Collection Time: 07/08/2020  9:06 AM   Specimen: BLOOD  Result Value Ref Range   Specimen Description BLOOD RIGHT ARM    Special Requests      BOTTLES DRAWN AEROBIC AND ANAEROBIC Blood Culture results may not be optimal due to an inadequate volume of blood received in culture bottles Performed at [facility], [address] [place], [place] [address]    Culture PENDING    Report Status PENDING   Culture, blood (Routine x 2)     Status: None (Preliminary result)   Collection Time: 07/01/2020  9:07 AM   Specimen: BLOOD  Result Value Ref Range   Specimen Description BLOOD LEFT ARM    Special Requests      BOTTLES DRAWN AEROBIC AND ANAEROBIC Blood Culture adequate volume Performed at [facility], [address] [place], [place] [address]    Culture PENDING    Report Status PENDING   Resp Panel by RT-PCR (Flu A&B, Covid) Nasopharyngeal Swab     Status: None   Collection Time: 07/04/2020  9:07 AM   Specimen: Nasopharyngeal Swab; Nasopharyngeal(NP) swabs in vial transport medium  Result Value  Ref Range  SARS Coronavirus 2 by RT PCR NEGATIVE NEGATIVE    Comment: (NOTE) SARS-CoV-2 target nucleic acids are NOT DETECTED.  The SARS-CoV-2 RNA is generally detectable in upper respiratory specimens during the acute phase of infection. The lowest concentration of SARS-CoV-2 viral copies this assay can detect is 138 copies/mL. A negative result does not preclude SARS-Cov-2 infection and should not be used as the sole basis for treatment or other patient management decisions. A negative result may occur with  improper specimen collection/handling, submission of specimen other than nasopharyngeal swab, presence of viral mutation(s) within the areas targeted by this assay, and inadequate number of viral copies(<138 copies/mL). A negative result must be combined with clinical observations, patient history, and epidemiological information. The expected result is Negative.  Fact Sheet for Patients:  EntrepreneurPulse.com.au  Fact Sheet for Healthcare Providers:  IncredibleEmployment.be  This test is no t yet approved or cleared by the [place] FDA and  has been authorized for detection and/or diagnosis of SARS-CoV-2 by FDA under an Emergency Use Authorization (EUA). This EUA will remain  in effect (meaning this test can be used) for the duration of the COVID-19 declaration under Section 564(b)(1) of the Act, 21 U.S.C.section 360bbb-3(b)(1), unless the authorization is terminated  or revoked sooner.       Influenza A by PCR NEGATIVE NEGATIVE   Influenza B by PCR NEGATIVE NEGATIVE    Comment: (NOTE) The Xpert Xpress SARS-CoV-2/FLU/RSV plus assay is intended as an aid in the diagnosis of influenza from Nasopharyngeal swab specimens and should not be used as a sole basis for treatment. Nasal washings and aspirates are unacceptable for Xpert Xpress SARS-CoV-2/FLU/RSV testing.  Fact Sheet for  Patients: EntrepreneurPulse.com.au  Fact Sheet for Healthcare Providers: IncredibleEmployment.be  This test is not yet approved or cleared by the [place] FDA and has been authorized for detection and/or diagnosis of SARS-CoV-2 by FDA under an Emergency Use Authorization (EUA). This EUA will remain in effect (meaning this test can be used) for the duration of the COVID-19 declaration under Section 564(b)(1) of the Act, 21 U.S.C. section 360bbb-3(b)(1), unless the authorization is terminated or revoked.  Performed at [facility], [address], [place], [place] [address]     CT ABDOMEN PELVIS WO CONTRAST  Result Date: 07/13/2020 CLINICAL DATA:  86 year old male with abdominal abscess and infection suspected EXAM: CT ABDOMEN AND PELVIS WITHOUT CONTRAST TECHNIQUE: Multidetector CT imaging of the abdomen and pelvis was performed following the standard protocol without IV contrast. COMPARISON:  06/11/2020 FINDINGS: Lower chest: Atelectatic changes at the lung bases. No acute finding. Hepatobiliary: Unremarkable appearance of liver. Gallbladder is decompressed. Minimal high density material within the dependent gallbladder. Pancreas: Relatively atrophic pancreas parenchyma. Spleen: Coarse calcifications within the spleen parenchyma compatible with prior granulomatous disease. Adrenals/Urinary Tract: - Right adrenal gland: Nodule in the lateral limb of the right adrenal gland is unchanged from prior. - Left adrenal gland: Unremarkable. - Right kidney: None obstructing stone in the superior right kidney measures 3 mm. No hydronephrosis. Perinephric stranding within the fat. No focal lesion. - Left Kidney: No hydronephrosis, nephrolithiasis, inflammation, or ureteral dilation. No focal lesion. - Urinary Bladder: Urinary bladder partially distended. Impression on the bladder base from the prostate. Stomach/Bowel: The bowel is not well evaluated given the absence of  IV contrast and PO contrast. Small hiatal hernia with distention of the stomach. Redemonstration of left inguinal hernia containing loops of small bowel with air-fluid level. Distended small bowel within the mid abdomen and low abdomen with  air-fluid levels, new from the comparison. There is a combination of free air and flocculent material within the root of the small bowel mesentery, interposed between leaves of mesentery extending into both left and right abdomen and into the upper abdomen. Free air extends superiorly to the diaphragm on the right. Colon is relatively decompressed with minimal stool burden. Vascular/Lymphatic: Atherosclerotic changes of the abdominal aorta as well as the mesenteric, renal arteries, and iliac arteries. No adenopathy. Stranding within the small bowel mesentery. Reproductive: Transverse diameter of the prostate measures 6.2 cm. Other: Ventral hernia containing mesenteric fat. Musculoskeletal: No acute displaced fracture. Degenerative changes of the spine. IMPRESSION: Evidence of hollow viscus perforation with diffuse peritoneal contamination including free air and flocculent material, with either colonic or small bowel source possible. Gastric/Peri pyloric source is favored unlikely given the absence of sentinel air in the liver hilum. Surgical consultation is indicated. These above preliminary were discussed by telephone at the time of interpretation on 07/21/2020 at 9:55 am with Dr. Veryl Speak. Electronically Signed   By: Corrie Mckusick D.O.   On: 07/13/2020 10:33   DG Chest 2 View  Result Date: 07/12/2020 CLINICAL DATA:  Suspected sepsis EXAM: CHEST - 2 VIEW COMPARISON:  06/10/2020 FINDINGS: Heart size is enlarged as before. Increasing opacification in the retrocardiac region when compared to the prior study. Hilar structures with central pulmonary vascular engorgement. Mild increased interstitial markings some which may be chronic, baseline similar to the previous exam. No  sign of pleural effusion. On limited assessment no acute skeletal process. IMPRESSION: 1. Increasing opacification in the retrocardiac region when compared to the prior study. Suspicious for developing pneumonia in the LEFT lower lobe. Differential includes volume loss. 2. Question of background early pulmonary edema. 3. Stable cardiomegaly. Electronically Signed   By: Zetta Bills M.D.   On: 07/15/2020 09:55    ROS:  Pertinent items are noted in HPI.  Blood pressure (!) 97/59, pulse 84, temperature (!) 101.1 F (38.4 C), temperature source Rectal, resp. rate (!) 29, height 6\' 3"  (1.905 m), weight 75.3 kg, SpO2 100 %. Physical Exam: Cachectic white male in no acute distress Head is normocephalic, atraumatic.  Patient is hard of hearing. Lungs are clear auscultation with equal breath sounds bilaterally Heart examination reveals an irregularly irregular rate and rhythm Abdomen is tender throughout with mild rigidity noted.  Abdomen is flat.  CT scan images personally reviewed Previous admission notes reviewed  Assessment/Plan: Impression: Perforated viscus with peritonitis, sepsis, history of CHF, cardiomegaly Plan: I had an extensive discussion with the patient concerning his peritonitis.  He is fully aware that without surgery he will expire.  With surgery he may require prolonged life support as well as the possibility of a colostomy.  He has been DNR and now states he does not want surgery.  He realizes that he will expire from the disease process and wants to be on comfort care only.  This was confirmed by Dr. Stark Jock.  Patient will be admitted to the hospitalist for comfort care.  Aviva Signs 07/08/2020, 11:45 AM

## 2020-07-16 NOTE — ED Triage Notes (Signed)
Patient from home via EMS with complaints of abdominal pain and generalized weakness x2 days.

## 2020-07-16 NOTE — ED Notes (Signed)
Receiving nurse unable to take report at this time due to situation on floor. Will call back.

## 2020-07-16 NOTE — ED Notes (Signed)
Patient's wife called back and states that she does not want antibiotics to be stopped at this time. Tearful and states that she knows it could possibly prolong his life but is certain he will not suffer or be uncomfortable. Dr. Carles Collet notified.

## 2020-07-16 NOTE — ED Notes (Signed)
Date and time results received: 07/24/2020 9:28 AM  (use smartphrase ".now" to insert current time)  Test: Lactic Acid Critical Value: 6.8  Name of Provider Notified: Dr. Stark Jock  Orders Received? Or Actions Taken?:

## 2020-07-16 NOTE — ED Provider Notes (Signed)
[facility] EMERGENCY DEPARTMENT Provider Note   CSN: [id number] Arrival date & time: 07/10/2020  4287     History Chief Complaint  Patient presents with  . Abdominal Pain  . Weakness    Drew Reed is a 86 y.o. male.  Patient is a 86 year old male with past medical history of hypertension, heart failure, atrial fibrillation, chronic renal insufficiency.  He is brought today for evaluation of fever, weakness, and abdominal pain that has progressed over the past several days.  According to family, he has been less active and more sluggish.  Patient is very hard of hearing and offers little additional history.  It does appear as though he was recently admitted for weakness and symptomatic anemia for which he was given transfusion.  The history is provided by the patient.  Abdominal Pain Pain location:  Generalized Pain radiates to:  Does not radiate Pain severity:  Moderate Duration:  3 days Timing:  Constant Progression:  Worsening Relieved by:  Nothing Worsened by:  Nothing Weakness Associated symptoms: abdominal pain        Past Medical History:  Diagnosis Date  . Chronic systolic heart failure ([place])   . Hypertension   . Kidney infection    2010  . Thyroid disease     Patient Active Problem List   Diagnosis Date Noted  . Symptomatic anemia 06/10/2020  . Occult blood positive stool 06/10/2020  . Iron deficiency anemia due to chronic blood loss 06/10/2020  . Acute on chronic systolic CHF (congestive heart failure) ([place]) 06/10/2020  . DNR (do not resuscitate) 06/10/2020  . Fall at home, initial encounter 06/10/2020  . Multiple fractures of ribs, left side, initial encounter for closed fracture 06/10/2020  . Left hip pain 06/10/2020  . Leukocytosis 08/16/2018  . AKI (acute kidney injury) ([place]) 08/16/2018  . Hypothyroidism 08/16/2018  . Cellulitis of right upper extremity 08/15/2018  . Cholecystostomy care ([place]) 06/10/2013  . Atrial fibrillation ([place])  05/31/2013  . Bacteremia 05/31/2013  . Hypokalemia 05/31/2013  . Chronic systolic heart failure ([place]) 05/31/2013  . Essential hypertension, benign 05/31/2013  . Septic shock ([place]) 05/28/2013  . UTI (urinary tract infection) 05/28/2013  . Cholecystitis, acute 05/28/2013  . Chronic kidney disease (CKD), stage IV (severe) ([place]) 05/28/2013    Past Surgical History:  Procedure Laterality Date  . CHOLECYSTECTOMY         History reviewed. No pertinent family history.  Social History   Tobacco Use  . Smoking status: Never Smoker  . Smokeless tobacco: Never Used  Substance Use Topics  . Alcohol use: No  . Drug use: No    Home Medications Prior to Admission medications   Medication Sig Start Date End Date Taking? Authorizing Provider  acetaminophen (TYLENOL) 325 MG tablet Take 1 tablet (325 mg total) by mouth every 4 (four) hours as needed for pain or fever. 06/02/13   Delfina Redwood, MD  carboxymethylcellulose (REFRESH PLUS) 0.5 % SOLN Apply 1 drop to eye 3 (three) times daily as needed.    [provider]  cetirizine (ZYRTEC) 10 MG tablet Take 10 mg by mouth daily as needed for allergies.    [provider]  cholecalciferol (VITAMIN D3) 25 MCG (1000 UT) tablet Take 1,000 Units by mouth daily.    [provider]  docusate sodium (COLACE) 100 MG capsule Take 100 mg by mouth 2 (two) times daily.    [provider]  ferrous sulfate 325 (65 FE) MG EC tablet Take 1 tablet (  325 mg total) by mouth 2 (two) times daily. 06/12/20 06/12/21  Kathie Dike, MD  finasteride (PROSCAR) 5 MG tablet Take 5 mg by mouth daily.    [provider]  furosemide (LASIX) 40 MG tablet Take 1 tablet (40 mg total) by mouth daily. 06/12/20 06/12/21  Kathie Dike, MD  guaifenesin (HUMIBID E) 400 MG TABS tablet Take 400 mg by mouth daily as needed (for congestion).    [provider]  levothyroxine (SYNTHROID, LEVOTHROID) 25 MCG tablet Take 25 mcg by  mouth daily before breakfast.    [provider]  magnesium oxide (MAG-OX) 400 MG tablet Take 1 tablet (400 mg total) by mouth daily. 08/16/18   Kathie Dike, MD  potassium chloride 20 MEQ TBCR Take 20 mEq by mouth daily. 06/12/20   Kathie Dike, MD  tamsulosin (FLOMAX) 0.4 MG CAPS capsule Take 0.4 mg by mouth daily.    [provider]    Allergies    Patient has no known allergies.  Review of Systems   Review of Systems  Gastrointestinal: Positive for abdominal pain.  Neurological: Positive for weakness.  All other systems reviewed and are negative.   Physical Exam Updated Vital Signs BP (!) 92/48   Pulse 88   Temp (!) 101.1 F (38.4 C) (Rectal)   Resp 20   Ht 6\' 3"  (1.905 m)   Wt 75.3 kg   SpO2 100%   BMI 20.75 kg/m   Physical Exam Vitals and nursing note reviewed.  Constitutional:      General: He is not in acute distress.    Appearance: He is well-developed. He is not diaphoretic.  HENT:     Head: Normocephalic and atraumatic.  Cardiovascular:     Rate and Rhythm: Normal rate and regular rhythm.     Heart sounds: No murmur heard.  No friction rub.  Pulmonary:     Effort: Pulmonary effort is normal. No respiratory distress.     Breath sounds: Normal breath sounds. No wheezing or rales.  Abdominal:     General: Bowel sounds are normal. There is no distension.     Palpations: Abdomen is soft.     Tenderness: There is generalized abdominal tenderness. There is no right CVA tenderness, left CVA tenderness, guarding or rebound.  Musculoskeletal:        General: Normal range of motion.     Cervical back: Normal range of motion and neck supple.  Skin:    General: Skin is warm and dry.  Neurological:     Mental Status: He is alert and oriented to person, place, and time.     Coordination: Coordination normal.     ED Results / Procedures / Treatments   Labs (all labs ordered are listed, but only abnormal results are displayed) Labs  Reviewed  CULTURE, BLOOD (ROUTINE X 2)  CULTURE, BLOOD (ROUTINE X 2)  RESP PANEL BY RT-PCR (FLU A&B, COVID) ARPGX2  COMPREHENSIVE METABOLIC PANEL  LACTIC ACID, PLASMA  LACTIC ACID, PLASMA  CBC WITH DIFFERENTIAL/PLATELET  PROTIME-INR  URINALYSIS, ROUTINE W REFLEX MICROSCOPIC    EKG None  Radiology No results found.  Procedures Procedures (including critical care time)  Medications Ordered in ED Medications - No data to display  ED Course  I have reviewed the triage vital signs and the nursing notes.  Pertinent labs & imaging results that were available during my care of the patient were reviewed by me and considered in my medical decision making (see chart for details).  MDM Rules/Calculators/A&P  Patient is a 86 year old male with past medical history as per HPI.  He presents with a 2-day history of weakness and abdominal pain.  Patient arrived here febrile and hypotensive.  Sepsis orders placed and workup initiated.  Laboratory studies reveal a white count of 19,000 with left shift, lactate of 6.8, and worsening of his renal function.  CT scan obtained unfortunately shows what appears to be a bowel perforation with inflammatory change within the peritoneal cavity.  The above findings were discussed with Dr. Arnoldo Morale from general surgery who has also come to the ER to evaluate.  Patient was offered surgery, however politely declines.  Patient understands that without surgery this is most definitely a fatal condition that would likely proceed to sepsis and death.  I have also discussed this with the patient's wife who is present at bedside.  She is also in agreement with no surgical intervention.  At this point, patient will be admitted to the hospitalist service for pain control and comfort measures.  I have spoken with Dr. Carles Collet who agrees to admit.  CRITICAL CARE Performed by: Veryl Speak Total critical care time: 50 minutes Critical care time was exclusive of separately  billable procedures and treating other patients. Critical care was necessary to treat or prevent imminent or life-threatening deterioration. Critical care was time spent personally by me on the following activities: development of treatment plan with patient and/or surrogate as well as nursing, discussions with consultants, evaluation of patient's response to treatment, examination of patient, obtaining history from patient or surrogate, ordering and performing treatments and interventions, ordering and review of laboratory studies, ordering and review of radiographic studies, pulse oximetry and re-evaluation of patient's condition.   Final Clinical Impression(s) / ED Diagnoses Final diagnoses:  None    Rx / DC Orders ED Discharge Orders    None       Veryl Speak, MD 07/05/2020 1310

## 2020-07-16 NOTE — ED Notes (Signed)
Patient to CT at this time

## 2020-07-16 NOTE — H&P (Signed)
History and Physical  Drew Reed OBS:[id number] DOB: 07/25/1920 DOA: 07/23/2020   PCP: [facility]   Patient coming from: Home  Chief Complaint: Abdominal pain  HPI:  Drew Reed is a 86 y.o. male with medical history of paroxysmal atrial fibrillation, hypertension, systolic and diastolic CHF, hypothyroidism, BPH, cholecystitis status post cholecystotomy tube presenting with 2 to 3-day history of fevers, chills, abdominal pain, and generalized weakness.  There is been no history of chest pain, shortness of breath, vomiting, diarrhea, hematochezia, melena.  Most recently, the patient was admitted to the hospital from 06/10/2020 to 06/13/2020 for symptomatic anemia.  During that hospitalization the patient was transfused 1 unit PRBC as well as given IV iron.  He was also treated for acute on chronic systolic CHF.  He was discharged home in stable condition with home health.  He had been doing well up until 2 to 3 days prior to this admission when he began developing abdominal pain and fevers.  Because of his worsening pain, he presented for further evaluation.  In the emergency department, the patient had a fever up to 101.1 F.  He was hypotensive with a systolic blood pressure in the 80s.  Oxygen saturation was 100% on room air.  BMP showed sodium 134, potassium 3.6, serum creatinine 1.70.  WBC was 19.0, hemoglobin 13.8, platelets 354,000.  CT of the abdomen and pelvis showed free air and flocculent material within the root of the mesentery of the small bowel as well as free air extending superiorly to the right hemidiaphragm suggestive of a perforated viscus.  The patient was started on IV Zosyn and IV fluids.  General surgery was consulted and evaluated the patient.  However, the patient and spouse expressed that he did not want any surgical intervention.  After discussion with general surgery, the patient and spouse understood that without surgical intervention that his  current medical illness represents a terminal condition from which he will ultimately die regardless of medical therapy.  The patient and wife expressed understanding and wanted to pursue a more comfort directed approach toward care.  Assessment/Plan: Septic shock -Present on admission -Secondary to peritonitis from perforated viscus -Patient presented with leukocytosis and fever and hypotension with elevated lactic acid -Lactic acid 6.8 -Continue Zosyn IV fluids for now -General surgery consulted -Patient and spouse expressed that did not want any surgical intervention  Peritonitis -secondary to perforated viscus -CT as discussed above -spouse and patient have expressed that they did not want any operative intervention -they understand this is a terminal condition without surger  Lactic acidosis -Secondary to sepsis and bowel perforation  Acute on chronic renal failure--CKD stage III -Secondary to sepsis and hemodynamic changes -Patient and family want to pursue more comfort care approach -Continue IV fluids for now  Chronic systolic and diastolic CHF -Patient had previous echo 10-14 with EF 45-50% -06/11/2020 echo EF 55% -Patient was on furosemide 20 mg daily at home -Patient and family wish to pursue more comfort care approach  Paroxysmal atrial fibrillation -Currently in sinus rhythm -Patient did not want any anticoagulation  Hyponatremia -Depletion and poor solute intake -Continue IV fluids temporarily  BPH -Previously on Flomax and finasteride  Hypothyroidism -Presently on Synthroid prior to admission  Iron deficiency Anemia -pt was on supplemental iron prior to admission  Goals of care -I had an extensive goals of care discussion with the patient and spouse at bedside Advance care planning, including the explanation and discussion of advance directives  was carried out with the patient and family.  Code status including explanations of "Full Code" and "DNR"  and alternatives were discussed in detail.  Discussion of end-of-life issues including but not limited palliative care, hospice care and the concept of hospice, other end-of-life care options, power of attorney for health care decisions, living wills, and physician orders for life-sustaining treatment were also discussed with the patient and family.  Total face to face time 16 minutes. -Spouse expressed that she wanted to continue IV fluids and antibiotics until the patient's family is able to arrive to visit after which the plan was to transition to full comfort and stopping antibiotics -Spouse expressed that she did not want any other extraordinary measures including but not limited to surgical intervention -As such, further labs and radiographic studies have been discontinued       Past Medical History:  Diagnosis Date  . Chronic systolic heart failure ([place])   . Hypertension   . Kidney infection    2010  . Thyroid disease    Past Surgical History:  Procedure Laterality Date  . CHOLECYSTECTOMY     Social History:  reports that he has never smoked. He has never used smokeless tobacco. He reports that he does not drink alcohol and does not use drugs.   History reviewed. No pertinent family history.   No Known Allergies   Prior to Admission medications   Medication Sig Start Date End Date Taking? Authorizing Provider  acetaminophen (TYLENOL) 325 MG tablet Take 1 tablet (325 mg total) by mouth every 4 (four) hours as needed for pain or fever. 06/02/13   Delfina Redwood, MD  carboxymethylcellulose (REFRESH PLUS) 0.5 % SOLN Apply 1 drop to eye 3 (three) times daily as needed.    [provider]  cetirizine (ZYRTEC) 10 MG tablet Take 10 mg by mouth daily as needed for allergies.    [provider]  cholecalciferol (VITAMIN D3) 25 MCG (1000 UT) tablet Take 1,000 Units by mouth daily.    [provider]  docusate sodium (COLACE) 100 MG capsule Take 100 mg  by mouth 2 (two) times daily.    [provider]  ferrous sulfate 325 (65 FE) MG EC tablet Take 1 tablet (325 mg total) by mouth 2 (two) times daily. 06/12/20 06/12/21  Kathie Dike, MD  finasteride (PROSCAR) 5 MG tablet Take 5 mg by mouth daily.    [provider]  furosemide (LASIX) 40 MG tablet Take 1 tablet (40 mg total) by mouth daily. 06/12/20 06/12/21  Kathie Dike, MD  guaifenesin (HUMIBID E) 400 MG TABS tablet Take 400 mg by mouth daily as needed (for congestion).    [provider]  levothyroxine (SYNTHROID, LEVOTHROID) 25 MCG tablet Take 25 mcg by mouth daily before breakfast.    [provider]  magnesium oxide (MAG-OX) 400 MG tablet Take 1 tablet (400 mg total) by mouth daily. 08/16/18   Kathie Dike, MD  potassium chloride 20 MEQ TBCR Take 20 mEq by mouth daily. 06/12/20   Kathie Dike, MD  tamsulosin (FLOMAX) 0.4 MG CAPS capsule Take 0.4 mg by mouth daily.    [provider]    Review of Systems:  Constitutional:  No weight loss, night sweats  Head&Eyes: No headache.  No vision loss.  No eye pain or scotoma ENT:  No Difficulty swallowing,Tooth/dental problems,Sore throat,  No ear ache, post nasal drip,  Cardio-vascular:  No chest pain, Orthopnea, PND, swelling in lower extremities,  dizziness, palpitations  GI:  No   vomiting, diarrhea,  hematochezia, melena, heartburn, indigestion, Resp:  No shortness of breath with exertion or at rest. No cough. No coughing up of blood .No wheezing.No chest wall deformity  Skin:  no rash or lesions.  GU:  no dysuria, change in color of urine, no urgency or frequency. No flank pain.  Musculoskeletal:  No joint pain or swelling. No decreased range of motion. No back pain.  Psych:  No change in mood or affect.. Neurologic: No headache, no dysesthesia, no focal weakness, no vision loss. No syncope  Physical Exam: Vitals:   07/08/2020 1130 06/30/2020 1145 07/05/2020 1200 07/01/2020  1230  BP: 101/61  (!) 103/58 (!) 89/57  Pulse: 82 79 74 84  Resp: 18 (!) 28 (!) 34 (!) 27  Temp:      TempSrc:      SpO2: 100% 100% 100% 100%  Weight:      Height:       General:  A&O x 2, NAD, nontoxic, pleasant/cooperative Head/Eye: No conjunctival hemorrhage, no icterus, [place]/AT, No nystagmus ENT:  No icterus,  No thrush, good dentition, no pharyngeal exudate Neck:  No masses, no lymphadenpathy, no bruits CV:  RRR, no rub, no gallop, no S3 Lung:  Bibasilar crackles, good air movement, no wheeze, no rhonchi Abdomen: soft/diffusely tender, +BS, nondistended, + peritoneal signs Ext: No cyanosis, No rashes, No petechiae, No lymphangitis, No edema  Labs on Admission:  Basic Metabolic Panel: Recent Labs  Lab 07/25/2020 0906  NA 134*  K 3.6  CL 97*  CO2 21*  GLUCOSE 104*  BUN 32*  CREATININE 1.70*  CALCIUM 8.8*   Liver Function Tests: Recent Labs  Lab 07/21/2020 0906  AST 48*  ALT 27  ALKPHOS 76  BILITOT 2.4*  PROT 6.8  ALBUMIN 3.8   No results for input(s): LIPASE, AMYLASE in the last 168 hours. No results for input(s): AMMONIA in the last 168 hours. CBC: Recent Labs  Lab 07/03/2020 0906  WBC 19.0*  NEUTROABS 16.9*  HGB 13.8  HCT 43.6  MCV 81.5  PLT 354   Coagulation Profile: Recent Labs  Lab 07/21/2020 0906  INR 1.4*   Cardiac Enzymes: No results for input(s): CKTOTAL, CKMB, CKMBINDEX, TROPONINI in the last 168 hours. BNP: Invalid input(s): POCBNP CBG: No results for input(s): GLUCAP in the last 168 hours. Urine analysis:    Component Value Date/Time   COLORURINE AMBER (A) 07/13/2020 0851   APPEARANCEUR CLEAR 07/14/2020 0851   LABSPEC 1.018 07/23/2020 0851   PHURINE 5.0 07/20/2020 0851   GLUCOSEU NEGATIVE 07/15/2020 0851   HGBUR NEGATIVE 07/12/2020 0851   BILIRUBINUR NEGATIVE 07/17/2020 0851   KETONESUR 5 (A) 07/15/2020 0851   PROTEINUR 30 (A) 07/04/2020 0851   UROBILINOGEN 0.2 05/28/2013 1125   NITRITE NEGATIVE 07/24/2020 0851   LEUKOCYTESUR  NEGATIVE 07/04/2020 0851   Sepsis Labs: @LABRCNTIP (procalcitonin:4,lacticidven:4) ) Recent Results (from the past 240 hour(s))  Culture, blood (Routine x 2)     Status: None (Preliminary result)   Collection Time: 07/19/2020  9:06 AM   Specimen: BLOOD  Result Value Ref Range Status   Specimen Description BLOOD RIGHT ARM  Final   Special Requests   Final    BOTTLES DRAWN AEROBIC AND ANAEROBIC Blood Culture results may not be optimal due to an inadequate volume of blood received in culture bottles Performed at [facility], [address] [place], [place] [address]    Culture PENDING  Incomplete   Report Status PENDING  Incomplete  Culture, blood (Routine x 2)     Status: None (Preliminary result)   Collection Time: 07/05/2020  9:07 AM   Specimen: BLOOD  Result Value Ref Range Status   Specimen Description BLOOD LEFT ARM  Final   Special Requests   Final    BOTTLES DRAWN AEROBIC AND ANAEROBIC Blood Culture adequate volume Performed at [facility], [address], [place], [place] [address]    Culture PENDING  Incomplete   Report Status PENDING  Incomplete  Resp Panel by RT-PCR (Flu A&B, Covid) Nasopharyngeal Swab     Status: None   Collection Time: 07/13/2020  9:07 AM   Specimen: Nasopharyngeal Swab; Nasopharyngeal(NP) swabs in vial transport medium  Result Value Ref Range Status   SARS Coronavirus 2 by RT PCR NEGATIVE NEGATIVE Final    Comment: (NOTE) SARS-CoV-2 target nucleic acids are NOT DETECTED.  The SARS-CoV-2 RNA is generally detectable in upper respiratory specimens during the acute phase of infection. The lowest concentration of SARS-CoV-2 viral copies this assay can detect is 138 copies/mL. A negative result does not preclude SARS-Cov-2 infection and should not be used as the sole basis for treatment or other patient management decisions. A negative result may occur with  improper specimen collection/handling, submission of specimen other than nasopharyngeal swab,  presence of viral mutation(s) within the areas targeted by this assay, and inadequate number of viral copies(<138 copies/mL). A negative result must be combined with clinical observations, patient history, and epidemiological information. The expected result is Negative.  Fact Sheet for Patients:  EntrepreneurPulse.com.au  Fact Sheet for Healthcare Providers:  IncredibleEmployment.be  This test is no t yet approved or cleared by the [place] FDA and  has been authorized for detection and/or diagnosis of SARS-CoV-2 by FDA under an Emergency Use Authorization (EUA). This EUA will remain  in effect (meaning this test can be used) for the duration of the COVID-19 declaration under Section 564(b)(1) of the Act, 21 U.S.C.section 360bbb-3(b)(1), unless the authorization is terminated  or revoked sooner.       Influenza A by PCR NEGATIVE NEGATIVE Final   Influenza B by PCR NEGATIVE NEGATIVE Final    Comment: (NOTE) The Xpert Xpress SARS-CoV-2/FLU/RSV plus assay is intended as an aid in the diagnosis of influenza from Nasopharyngeal swab specimens and should not be used as a sole basis for treatment. Nasal washings and aspirates are unacceptable for Xpert Xpress SARS-CoV-2/FLU/RSV testing.  Fact Sheet for Patients: EntrepreneurPulse.com.au  Fact Sheet for Healthcare Providers: IncredibleEmployment.be  This test is not yet approved or cleared by the [place] FDA and has been authorized for detection and/or diagnosis of SARS-CoV-2 by FDA under an Emergency Use Authorization (EUA). This EUA will remain in effect (meaning this test can be used) for the duration of the COVID-19 declaration under Section 564(b)(1) of the Act, 21 U.S.C. section 360bbb-3(b)(1), unless the authorization is terminated or revoked.  Performed at [facility], [address], [place], [place] [address]      Radiological Exams on  Admission: CT ABDOMEN PELVIS WO CONTRAST  Result Date: 07/07/2020 CLINICAL DATA:  86 year old male with abdominal abscess and infection suspected EXAM: CT ABDOMEN AND PELVIS WITHOUT CONTRAST TECHNIQUE: Multidetector CT imaging of the abdomen and pelvis was performed following the standard protocol without IV contrast. COMPARISON:  06/11/2020 FINDINGS: Lower chest: Atelectatic changes at the lung bases. No acute finding. Hepatobiliary: Unremarkable appearance of liver. Gallbladder is decompressed. Minimal high density material within the dependent gallbladder. Pancreas: Relatively atrophic pancreas parenchyma. Spleen: Coarse  calcifications within the spleen parenchyma compatible with prior granulomatous disease. Adrenals/Urinary Tract: - Right adrenal gland: Nodule in the lateral limb of the right adrenal gland is unchanged from prior. - Left adrenal gland: Unremarkable. - Right kidney: None obstructing stone in the superior right kidney measures 3 mm. No hydronephrosis. Perinephric stranding within the fat. No focal lesion. - Left Kidney: No hydronephrosis, nephrolithiasis, inflammation, or ureteral dilation. No focal lesion. - Urinary Bladder: Urinary bladder partially distended. Impression on the bladder base from the prostate. Stomach/Bowel: The bowel is not well evaluated given the absence of IV contrast and PO contrast. Small hiatal hernia with distention of the stomach. Redemonstration of left inguinal hernia containing loops of small bowel with air-fluid level. Distended small bowel within the mid abdomen and low abdomen with air-fluid levels, new from the comparison. There is a combination of free air and flocculent material within the root of the small bowel mesentery, interposed between leaves of mesentery extending into both left and right abdomen and into the upper abdomen. Free air extends superiorly to the diaphragm on the right. Colon is relatively decompressed with minimal stool burden.  Vascular/Lymphatic: Atherosclerotic changes of the abdominal aorta as well as the mesenteric, renal arteries, and iliac arteries. No adenopathy. Stranding within the small bowel mesentery. Reproductive: Transverse diameter of the prostate measures 6.2 cm. Other: Ventral hernia containing mesenteric fat. Musculoskeletal: No acute displaced fracture. Degenerative changes of the spine. IMPRESSION: Evidence of hollow viscus perforation with diffuse peritoneal contamination including free air and flocculent material, with either colonic or small bowel source possible. Gastric/Peri pyloric source is favored unlikely given the absence of sentinel air in the liver hilum. Surgical consultation is indicated. These above preliminary were discussed by telephone at the time of interpretation on 07/02/2020 at 9:55 am with Dr. Veryl Speak. Electronically Signed   By: Corrie Mckusick D.O.   On: 07/11/2020 10:33   DG Chest 2 View  Result Date: 07/22/2020 CLINICAL DATA:  Suspected sepsis EXAM: CHEST - 2 VIEW COMPARISON:  06/10/2020 FINDINGS: Heart size is enlarged as before. Increasing opacification in the retrocardiac region when compared to the prior study. Hilar structures with central pulmonary vascular engorgement. Mild increased interstitial markings some which may be chronic, baseline similar to the previous exam. No sign of pleural effusion. On limited assessment no acute skeletal process. IMPRESSION: 1. Increasing opacification in the retrocardiac region when compared to the prior study. Suspicious for developing pneumonia in the LEFT lower lobe. Differential includes volume loss. 2. Question of background early pulmonary edema. 3. Stable cardiomegaly. Electronically Signed   By: Zetta Bills M.D.   On: 07/06/2020 09:55    EKG: Independently reviewed. none    Time spent:60 minutes Code Status:   FULL COMFORT Family Communication:  Spouse updated at bedside Disposition Plan: expect in-hospital death Consults  called: general surgery DVT Prophylaxis: FULL COMFORT  Orson Eva, DO  [facility] Pager [phone]  If 7PM-7AM, please contact night-coverage [email or web address] Password [facility] 07/17/2020, 12:48 PM

## 2020-07-17 DIAGNOSIS — I5042 Chronic combined systolic (congestive) and diastolic (congestive) heart failure: Secondary | ICD-10-CM | POA: Diagnosis not present

## 2020-07-17 DIAGNOSIS — N179 Acute kidney failure, unspecified: Secondary | ICD-10-CM

## 2020-07-17 DIAGNOSIS — I10 Essential (primary) hypertension: Secondary | ICD-10-CM

## 2020-07-17 DIAGNOSIS — I480 Paroxysmal atrial fibrillation: Secondary | ICD-10-CM

## 2020-07-17 DIAGNOSIS — K659 Peritonitis, unspecified: Secondary | ICD-10-CM

## 2020-07-17 DIAGNOSIS — N1831 Chronic kidney disease, stage 3a: Secondary | ICD-10-CM

## 2020-07-17 DIAGNOSIS — R6521 Severe sepsis with septic shock: Secondary | ICD-10-CM

## 2020-07-17 DIAGNOSIS — E872 Acidosis: Secondary | ICD-10-CM

## 2020-07-17 DIAGNOSIS — A419 Sepsis, unspecified organism: Secondary | ICD-10-CM

## 2020-07-17 DIAGNOSIS — R198 Other specified symptoms and signs involving the digestive system and abdomen: Secondary | ICD-10-CM | POA: Diagnosis not present

## 2020-07-17 NOTE — Progress Notes (Signed)
PROGRESS NOTE  Drew Reed ZOX:[id number] DOB: 1920/04/27 DOA: 07/08/2020 PCP: [facility]  Brief History:  86 y.o. male with medical history of paroxysmal atrial fibrillation, hypertension, systolic and diastolic CHF, hypothyroidism, BPH, cholecystitis status post cholecystotomy tube presenting with 2 to 3-day history of fevers, chills, abdominal pain, and generalized weakness.  There is been no history of chest pain, shortness of breath, vomiting, diarrhea, hematochezia, melena.  Most recently, the patient was admitted to the hospital from 06/10/2020 to 06/13/2020 for symptomatic anemia.  During that hospitalization the patient was transfused 1 unit PRBC as well as given IV iron.  He was also treated for acute on chronic systolic CHF.  He was discharged home in stable condition with home health.  He had been doing well up until 2 to 3 days prior to this admission when he began developing abdominal pain and fevers.  Because of his worsening pain, he presented for further evaluation.  In the emergency department, the patient had a fever up to 101.1 F.  He was hypotensive with a systolic blood pressure in the 80s.  Oxygen saturation was 100% on room air.  BMP showed sodium 134, potassium 3.6, serum creatinine 1.70.  WBC was 19.0, hemoglobin 13.8, platelets 354,000.  CT of the abdomen and pelvis showed free air and flocculent material within the root of the mesentery of the small bowel as well as free air extending superiorly to the right hemidiaphragm suggestive of a perforated viscus.  The patient was started on IV Zosyn and IV fluids.  General surgery was consulted and evaluated the patient.  However, the patient and spouse expressed that he did not want any surgical intervention.  After discussion with general surgery, the patient and spouse understood that without surgical intervention that his current medical illness represents a terminal condition from which he will ultimately  die regardless of medical therapy.  The patient and wife expressed understanding and wanted to pursue a more comfort directed approach toward care.   Assessment/Plan: Septic shock -Present on admission -Secondary to peritonitis from perforated viscus -Patient presented with leukocytosis and fever and hypotension with elevated lactic acid -Lactic acid 6.8>>5.2 -Continue Zosyn IV fluids for now -General surgery consulted -Patient and spouse expressed that did not want any surgical intervention with the understanding this will lead to his death  Peritonitis -secondary to perforated viscus -CT as discussed above -spouse and patient have expressed that they did not want any operative intervention -they understand this is a terminal condition without surgery  Lactic acidosis -Secondary to sepsis and bowel perforation  Acute on chronic renal failure--CKD stage III -Secondary to sepsis and hemodynamic changes -Patient and family want to pursue more comfort care approach -Continue IV fluids for now>>stopped due to chest congestion  Chronic systolic and diastolic CHF -Patient had previous echo 05/29/13 with EF 45-50% -06/11/2020 echo EF 55% -Patient was on furosemide 20 mg daily at home -Patient and family wish to pursue more comfort care approach  Paroxysmal atrial fibrillation -rate controlled -Patient did not want any anticoagulation -focus on comfort care  Hyponatremia -Depletion and poor solute intake -Continued IV fluids temporarily  BPH -Previously on Flomax and finasteride  Hypothyroidism -Presently on Synthroid prior to admission  Iron deficiency Anemia -pt was on supplemental iron prior to admission  Goals of care -I had an extensive goals of care discussion with the patient and spouse at bedside -DNR -Initially Spouse expressed that she wanted to  continue IV fluids and antibiotics until the patient's family is able to arrive to visit after which the  plan was to transition to full comfort and stopping antibiotics -now spouse wants to continue antibiotics -I have explained this will only prolong the patient's suffering which she understands -further labs and radiographic studies have been discontinued          Status is: Inpatient  Remains inpatient appropriate because:IV treatments appropriate due to intensity of illness or inability to take PO   Dispo: The patient is from: Home              Anticipated d/c is to: In-hospital Death              Anticipated d/c date is: 2 days              Patient currently is not medically stable to d/c.        Family Communication:   Son & daughter updated at bedside 11/20;  Attempted to call spouse--no answer  Consultants:  General surgery  Code Status:  DNR  DVT Prophylaxis:  Comfort focused   Procedures: As Listed in Progress Note Above  Antibiotics: Zosyn 11/19>>     Subjective: Patient denies fevers, chills, headache, chest pain, dyspnea, nausea, vomiting, diarrhea, abdominal pain, dysuria, hematuria,    Objective: Vitals:   07/05/2020 1958 07/19/2020 2000 07/17/20 0427 07/17/20 0521  BP: (!) 86/51  121/65 107/64  Pulse: (!) 47  (!) 106 (!) 101  Resp: (!) 2 20 15 16   Temp: (!) 96.5 F (35.8 C)  98.2 F (36.8 C) (!) 97.5 F (36.4 C)  TempSrc: Oral   Oral  SpO2:   92% 95%  Weight:      Height:        Intake/Output Summary (Last 24 hours) at 07/17/2020 1141 Last data filed at 07/11/2020 1856 Gross per 24 hour  Intake 1531.06 ml  Output --  Net 1531.06 ml   Weight change:  Exam:   General:  Pt is alert, follows commands appropriately, not in acute distress  HEENT: No icterus, No thrush, No neck mass, /AT  Cardiovascular: RRR, S1/S2, no rubs, no gallops  Respiratory: bibasilar crackles. No wheeze  Abdomen: Soft/+BS, diffusely tender, non distended, no guarding  Extremities: No edema, No lymphangitis, No petechiae, No rashes, no  synovitis   Data Reviewed: I have personally reviewed following labs and imaging studies Basic Metabolic Panel: Recent Labs  Lab 07/14/2020 0906  NA 134*  K 3.6  CL 97*  CO2 21*  GLUCOSE 104*  BUN 32*  CREATININE 1.70*  CALCIUM 8.8*   Liver Function Tests: Recent Labs  Lab 07/04/2020 0906  AST 48*  ALT 27  ALKPHOS 76  BILITOT 2.4*  PROT 6.8  ALBUMIN 3.8   No results for input(s): LIPASE, AMYLASE in the last 168 hours. No results for input(s): AMMONIA in the last 168 hours. Coagulation Profile: Recent Labs  Lab 07/15/2020 0906  INR 1.4*   CBC: Recent Labs  Lab 07/07/2020 0906  WBC 19.0*  NEUTROABS 16.9*  HGB 13.8  HCT 43.6  MCV 81.5  PLT 354   Cardiac Enzymes: No results for input(s): CKTOTAL, CKMB, CKMBINDEX, TROPONINI in the last 168 hours. BNP: Invalid input(s): POCBNP CBG: No results for input(s): GLUCAP in the last 168 hours. HbA1C: No results for input(s): HGBA1C in the last 72 hours. Urine analysis:    Component Value Date/Time   COLORURINE AMBER (A) 07/05/2020 [place]  07/14/2020 0851   LABSPEC 1.018 07/11/2020 [place] 5.0 07/02/2020 [place] 07/19/2020 0851   HGBUR NEGATIVE 07/04/2020 [place] 07/10/2020 0851   KETONESUR 5 (A) 07/12/2020 0851   PROTEINUR 30 (A) 07/17/2020 0851   UROBILINOGEN 0.2 05/28/2013 1125   NITRITE NEGATIVE 07/09/2020 0851   LEUKOCYTESUR NEGATIVE 06/29/2020 0851   Sepsis Labs: @LABRCNTIP (procalcitonin:4,lacticidven:4) ) Recent Results (from the past 240 hour(s))  Culture, blood (Routine x 2)     Status: None (Preliminary result)   Collection Time: 07/07/2020  9:06 AM   Specimen: BLOOD  Result Value Ref Range Status   Specimen Description BLOOD RIGHT ARM  Final   Special Requests   Final    BOTTLES DRAWN AEROBIC AND ANAEROBIC Blood Culture results may not be optimal due to an inadequate volume of blood received in culture bottles   Culture   Final    NO  GROWTH < 24 HOURS Performed at [facility], [address] [place], [place] [address]    Report Status PENDING  Incomplete  Culture, blood (Routine x 2)     Status: None (Preliminary result)   Collection Time: 07/17/2020  9:07 AM   Specimen: BLOOD  Result Value Ref Range Status   Specimen Description BLOOD LEFT ARM  Final   Special Requests   Final    BOTTLES DRAWN AEROBIC AND ANAEROBIC Blood Culture adequate volume   Culture   Final    NO GROWTH < 24 HOURS Performed at [facility], [address] [place], [place] [address]    Report Status PENDING  Incomplete  Resp Panel by RT-PCR (Flu A&B, Covid) Nasopharyngeal Swab     Status: None   Collection Time: 07/20/2020  9:07 AM   Specimen: Nasopharyngeal Swab; Nasopharyngeal(NP) swabs in vial transport medium  Result Value Ref Range Status   SARS Coronavirus 2 by RT PCR NEGATIVE NEGATIVE Final    Comment: (NOTE) SARS-CoV-2 target nucleic acids are NOT DETECTED.  The SARS-CoV-2 RNA is generally detectable in upper respiratory specimens during the acute phase of infection. The lowest concentration of SARS-CoV-2 viral copies this assay can detect is 138 copies/mL. A negative result does not preclude SARS-Cov-2 infection and should not be used as the sole basis for treatment or other patient management decisions. A negative result may occur with  improper specimen collection/handling, submission of specimen other than nasopharyngeal swab, presence of viral mutation(s) within the areas targeted by this assay, and inadequate number of viral copies(<138 copies/mL). A negative result must be combined with clinical observations, patient history, and epidemiological information. The expected result is Negative.  Fact Sheet for Patients:  EntrepreneurPulse.com.au  Fact Sheet for Healthcare Providers:  IncredibleEmployment.be  This test is no t yet approved or cleared by the [place] FDA and  has been  authorized for detection and/or diagnosis of SARS-CoV-2 by FDA under an Emergency Use Authorization (EUA). This EUA will remain  in effect (meaning this test can be used) for the duration of the COVID-19 declaration under Section 564(b)(1) of the Act, 21 U.S.C.section 360bbb-3(b)(1), unless the authorization is terminated  or revoked sooner.       Influenza A by PCR NEGATIVE NEGATIVE Final   Influenza B by PCR NEGATIVE NEGATIVE Final    Comment: (NOTE) The Xpert Xpress SARS-CoV-2/FLU/RSV plus assay is intended as an aid in the diagnosis of influenza from Nasopharyngeal swab specimens and should not be used as a sole basis for treatment. Nasal washings and  aspirates are unacceptable for Xpert Xpress SARS-CoV-2/FLU/RSV testing.  Fact Sheet for Patients: EntrepreneurPulse.com.au  Fact Sheet for Healthcare Providers: IncredibleEmployment.be  This test is not yet approved or cleared by the [place] FDA and has been authorized for detection and/or diagnosis of SARS-CoV-2 by FDA under an Emergency Use Authorization (EUA). This EUA will remain in effect (meaning this test can be used) for the duration of the COVID-19 declaration under Section 564(b)(1) of the Act, 21 U.S.C. section 360bbb-3(b)(1), unless the authorization is terminated or revoked.  Performed at [facility], [address], [place], [place] [address]      Scheduled Meds: . finasteride  5 mg Oral Daily  . tamsulosin  0.4 mg Oral Daily   Continuous Infusions: . lactated ringers Stopped (07/24/2020 1710)  . piperacillin-tazobactam (ZOSYN)  IV 3.375 g (07/17/20 0522)    Procedures/Studies: CT ABDOMEN PELVIS WO CONTRAST  Result Date: 07/09/2020 CLINICAL DATA:  86 year old male with abdominal abscess and infection suspected EXAM: CT ABDOMEN AND PELVIS WITHOUT CONTRAST TECHNIQUE: Multidetector CT imaging of the abdomen and pelvis was performed following the standard protocol  without IV contrast. COMPARISON:  06/11/2020 FINDINGS: Lower chest: Atelectatic changes at the lung bases. No acute finding. Hepatobiliary: Unremarkable appearance of liver. Gallbladder is decompressed. Minimal high density material within the dependent gallbladder. Pancreas: Relatively atrophic pancreas parenchyma. Spleen: Coarse calcifications within the spleen parenchyma compatible with prior granulomatous disease. Adrenals/Urinary Tract: - Right adrenal gland: Nodule in the lateral limb of the right adrenal gland is unchanged from prior. - Left adrenal gland: Unremarkable. - Right kidney: None obstructing stone in the superior right kidney measures 3 mm. No hydronephrosis. Perinephric stranding within the fat. No focal lesion. - Left Kidney: No hydronephrosis, nephrolithiasis, inflammation, or ureteral dilation. No focal lesion. - Urinary Bladder: Urinary bladder partially distended. Impression on the bladder base from the prostate. Stomach/Bowel: The bowel is not well evaluated given the absence of IV contrast and PO contrast. Small hiatal hernia with distention of the stomach. Redemonstration of left inguinal hernia containing loops of small bowel with air-fluid level. Distended small bowel within the mid abdomen and low abdomen with air-fluid levels, new from the comparison. There is a combination of free air and flocculent material within the root of the small bowel mesentery, interposed between leaves of mesentery extending into both left and right abdomen and into the upper abdomen. Free air extends superiorly to the diaphragm on the right. Colon is relatively decompressed with minimal stool burden. Vascular/Lymphatic: Atherosclerotic changes of the abdominal aorta as well as the mesenteric, renal arteries, and iliac arteries. No adenopathy. Stranding within the small bowel mesentery. Reproductive: Transverse diameter of the prostate measures 6.2 cm. Other: Ventral hernia containing mesenteric fat.  Musculoskeletal: No acute displaced fracture. Degenerative changes of the spine. IMPRESSION: Evidence of hollow viscus perforation with diffuse peritoneal contamination including free air and flocculent material, with either colonic or small bowel source possible. Gastric/Peri pyloric source is favored unlikely given the absence of sentinel air in the liver hilum. Surgical consultation is indicated. These above preliminary were discussed by telephone at the time of interpretation on 07/03/2020 at 9:55 am with Dr. Veryl Speak. Electronically Signed   By: Corrie Mckusick D.O.   On: 07/23/2020 10:33   DG Chest 2 View  Result Date: 07/02/2020 CLINICAL DATA:  Suspected sepsis EXAM: CHEST - 2 VIEW COMPARISON:  06/10/2020 FINDINGS: Heart size is enlarged as before. Increasing opacification in the retrocardiac region when compared to the prior study. Hilar structures with  central pulmonary vascular engorgement. Mild increased interstitial markings some which may be chronic, baseline similar to the previous exam. No sign of pleural effusion. On limited assessment no acute skeletal process. IMPRESSION: 1. Increasing opacification in the retrocardiac region when compared to the prior study. Suspicious for developing pneumonia in the LEFT lower lobe. Differential includes volume loss. 2. Question of background early pulmonary edema. 3. Stable cardiomegaly. Electronically Signed   By: Zetta Bills M.D.   On: 07/17/2020 09:55    Orson Eva, DO  [facility]  If 7PM-7AM, please contact night-coverage [email or web address] Password [email or web address] 07/17/2020, 11:41 AM   LOS: 1 day

## 2020-07-17 NOTE — Clinical Social Work Note (Signed)
[place] notification Done [phone]

## 2020-07-18 DIAGNOSIS — Z66 Do not resuscitate: Secondary | ICD-10-CM

## 2020-07-18 DIAGNOSIS — I5023 Acute on chronic systolic (congestive) heart failure: Secondary | ICD-10-CM

## 2020-07-18 DIAGNOSIS — R198 Other specified symptoms and signs involving the digestive system and abdomen: Secondary | ICD-10-CM | POA: Diagnosis present

## 2020-07-18 DIAGNOSIS — I480 Paroxysmal atrial fibrillation: Secondary | ICD-10-CM | POA: Diagnosis not present

## 2020-07-18 DIAGNOSIS — N179 Acute kidney failure, unspecified: Secondary | ICD-10-CM | POA: Diagnosis not present

## 2020-07-18 MED ORDER — DIPHENHYDRAMINE HCL 50 MG/ML IJ SOLN: 12.5000 mg | INTRAMUSCULAR | Status: DC | PRN

## 2020-07-18 MED ORDER — HALOPERIDOL LACTATE 2 MG/ML PO CONC: 0.5000 mg | ORAL | Status: DC | PRN

## 2020-07-18 MED ORDER — SENNA 8.6 MG PO TABS: 1.0000 | ORAL_TABLET | Freq: Every evening | ORAL | Status: DC | PRN

## 2020-07-18 MED ORDER — OXYCODONE HCL 5 MG PO TABS: 5.0000 mg | ORAL_TABLET | ORAL | Status: DC | PRN

## 2020-07-18 MED ORDER — ATROPINE SULFATE 1 % OP SOLN: 4.0000 [drp] | OPHTHALMIC | Status: DC | PRN

## 2020-07-18 MED ORDER — LORAZEPAM 2 MG/ML IJ SOLN: 1.0000 mg | INTRAMUSCULAR | Status: DC | PRN

## 2020-07-18 MED ORDER — HALOPERIDOL LACTATE 5 MG/ML IJ SOLN
0.5000 mg | INTRAMUSCULAR | Status: DC | PRN
  Administered 2020-07-18: 0.5 mg via INTRAVENOUS
  Filled 2020-07-18: qty 1

## 2020-07-18 MED ORDER — LORAZEPAM 1 MG PO TABS: 1.0000 mg | ORAL_TABLET | ORAL | Status: DC | PRN

## 2020-07-18 MED ORDER — LORAZEPAM 2 MG/ML PO CONC: 1.0000 mg | ORAL | Status: DC | PRN

## 2020-07-18 MED ORDER — POLYVINYL ALCOHOL 1.4 % OP SOLN: 1.0000 [drp] | Freq: Four times a day (QID) | OPHTHALMIC | Status: DC | PRN

## 2020-07-18 MED ORDER — TEMAZEPAM 7.5 MG PO CAPS: 7.5000 mg | ORAL_CAPSULE | Freq: Every evening | ORAL | Status: DC | PRN

## 2020-07-18 MED ORDER — BIOTENE DRY MOUTH MT LIQD: 15.0000 mL | OROMUCOSAL | Status: DC | PRN

## 2020-07-18 MED ORDER — HYDROMORPHONE HCL 1 MG/ML IJ SOLN
1.0000 mg | INTRAMUSCULAR | Status: DC | PRN
  Administered 2020-07-18: 1 mg via INTRAVENOUS
  Filled 2020-07-18: qty 1

## 2020-07-18 MED ORDER — LORAZEPAM 2 MG/ML IJ SOLN
1.0000 mg | INTRAMUSCULAR | Status: DC | PRN
  Administered 2020-07-18 (×2): 1 mg via INTRAVENOUS
  Filled 2020-07-18 (×2): qty 1

## 2020-07-18 MED ORDER — FLEET ENEMA 7-19 GM/118ML RE ENEM: 1.0000 | ENEMA | Freq: Every day | RECTAL | Status: DC | PRN

## 2020-07-18 MED ORDER — BISACODYL 10 MG RE SUPP: 10.0000 mg | Freq: Every day | RECTAL | Status: DC | PRN

## 2020-07-18 MED ORDER — HALOPERIDOL 0.5 MG PO TABS: 0.5000 mg | ORAL_TABLET | ORAL | Status: DC | PRN

## 2020-07-21 LAB — CULTURE, BLOOD (ROUTINE X 2)
Culture: NO GROWTH
Culture: NO GROWTH
Special Requests: ADEQUATE

## 2020-07-28 NOTE — Death Summary Note (Signed)
DEATH SUMMARY   Patient Details  Name: Drew Reed MRN: [id number] DOB: April 03, 1920  Admission/Discharge Information   Admit Date:  2020/07/17  Date of Death:   07-19-2020   Time of Death:  18-Nov-1728  Length of Stay: 2  Referring Physician: [facility]   Reason(s) for Hospitalization   ADMISSION HPI:  Drew Reed is a 86 y.o. male with medical history of paroxysmal atrial fibrillation, hypertension, systolic and diastolic CHF, hypothyroidism, BPH, cholecystitis status post cholecystotomy tube presenting with 2 to 3-day history of fevers, chills, abdominal pain, and generalized weakness.  There is been no history of chest pain, shortness of breath, vomiting, diarrhea, hematochezia, melena.  Most recently, the patient was admitted to the hospital from 06/10/2020 to 06/13/2020 for symptomatic anemia.  During that hospitalization the patient was transfused 1 unit PRBC as well as given IV iron.  He was also treated for acute on chronic systolic CHF.  He was discharged home in stable condition with home health.  He had been doing well up until 2 to 3 days prior to this admission when he began developing abdominal pain and fevers.  Because of his worsening pain, he presented for further evaluation.  In the emergency department, the patient had a fever up to 101.1 F.  He was hypotensive with a systolic blood pressure in the 80s.  Oxygen saturation was 100% on room air.  BMP showed sodium 134, potassium 3.6, serum creatinine 1.70.  WBC was 19.0, hemoglobin 13.8, platelets 354,000.  CT of the abdomen and pelvis showed free air and flocculent material within the root of the mesentery of the small bowel as well as free air extending superiorly to the right hemidiaphragm suggestive of a perforated viscus.  The patient was started on IV Zosyn and IV fluids.  General surgery was consulted and evaluated the patient.  However, the patient and spouse expressed that he did not want any surgical  intervention.  After discussion with general surgery, the patient and spouse understood that without surgical intervention that his current medical illness represents a terminal condition from which he will ultimately die regardless of medical therapy.  The patient and wife expressed understanding and wanted to pursue a more comfort directed approach toward care.  Pt was made full comfort measures on 07/19/21 and expired at 1730.   Diagnoses  Preliminary cause of death:  Secondary Diagnoses (including complications and co-morbidities):  Principal Problem:   Septic shock ([place]) Active Problems:   Chronic kidney disease (CKD), stage IV (severe) (HCC)   Atrial fibrillation (HCC)   Chronic systolic heart failure (HCC)   Essential hypertension, benign   Hypothyroidism   Acute on chronic systolic CHF (congestive heart failure) ([place])   DNR (do not resuscitate)   Perforated abdominal viscus   Peritonitis ([place])   Acute renal failure superimposed on stage 3a chronic kidney disease (HCC)   Lactic acidosis   Chronic combined systolic and diastolic CHF (congestive heart failure) (HCC)   AF (paroxysmal atrial fibrillation) (HCC)   Goals of care, counseling/discussion   Perforated viscus   Brief Hospital Course (including significant findings, care, treatment, and services provided and events leading to death)  Drew Reed is a 86 y.o. year old male   Septic shock -Present on admission -Secondary to peritonitis from perforated viscus -Patient presented with leukocytosis and fever and hypotension with elevated lactic acid -Lactic acid 6.8>>5.2 -Initially treated with Zosyn IV -General surgery consulted -Patient and spouse expressed that did not want any  surgical intervention with the understanding this will lead to his death -After discussion with family decision was made to start full comfort care measures.    Peritonitis -secondary to perforated viscus -CT as discussed above -spouse  and patient have expressed that they did not want any operative intervention -they understand this is a terminal condition without surgery - Full comfort care measures starting 11/21.   Lactic acidosis -Secondary to sepsis and bowel perforation  Acute on chronic renal failure--CKD stage III -Secondary to sepsis and hemodynamic changes -Patient and family want to pursue more comfort care approach -Continue IV fluids for now>>stopped due to chest congestion  Chronic systolic and diastolic CHF -Patient had previous echo 05/29/13 with EF 45-50% -06/11/2020 echo EF 55% -Patient was on furosemide 20 mg daily at home -Patient and family wish to pursue comfort care  Paroxysmal atrial fibrillation -rate controlled -Patient did not want any anticoagulation -focus on comfort care  Hyponatremia -Depletion and poor solute intake  BPH -Previously on Flomax and finasteride  Hypothyroidism -Presently on Synthroid prior to admission  Iron deficiency Anemia -pt was on supplemental iron prior to admission  Goals of care -I had an extensive goals of care discussion with the patient andspouse at bedside -DNR -11/21: family requested full comfort care measures, declined home hospice  Patient expired at 1730 on 07/19/20.   Pertinent Labs and Studies  Significant Diagnostic Studies CT ABDOMEN PELVIS WO CONTRAST  Result Date: 07/25/2020 CLINICAL DATA:  86 year old male with abdominal abscess and infection suspected EXAM: CT ABDOMEN AND PELVIS WITHOUT CONTRAST TECHNIQUE: Multidetector CT imaging of the abdomen and pelvis was performed following the standard protocol without IV contrast. COMPARISON:  06/11/2020 FINDINGS: Lower chest: Atelectatic changes at the lung bases. No acute finding. Hepatobiliary: Unremarkable appearance of liver. Gallbladder is decompressed. Minimal high density material within the dependent gallbladder. Pancreas: Relatively atrophic pancreas parenchyma. Spleen:  Coarse calcifications within the spleen parenchyma compatible with prior granulomatous disease. Adrenals/Urinary Tract: - Right adrenal gland: Nodule in the lateral limb of the right adrenal gland is unchanged from prior. - Left adrenal gland: Unremarkable. - Right kidney: None obstructing stone in the superior right kidney measures 3 mm. No hydronephrosis. Perinephric stranding within the fat. No focal lesion. - Left Kidney: No hydronephrosis, nephrolithiasis, inflammation, or ureteral dilation. No focal lesion. - Urinary Bladder: Urinary bladder partially distended. Impression on the bladder base from the prostate. Stomach/Bowel: The bowel is not well evaluated given the absence of IV contrast and PO contrast. Small hiatal hernia with distention of the stomach. Redemonstration of left inguinal hernia containing loops of small bowel with air-fluid level. Distended small bowel within the mid abdomen and low abdomen with air-fluid levels, new from the comparison. There is a combination of free air and flocculent material within the root of the small bowel mesentery, interposed between leaves of mesentery extending into both left and right abdomen and into the upper abdomen. Free air extends superiorly to the diaphragm on the right. Colon is relatively decompressed with minimal stool burden. Vascular/Lymphatic: Atherosclerotic changes of the abdominal aorta as well as the mesenteric, renal arteries, and iliac arteries. No adenopathy. Stranding within the small bowel mesentery. Reproductive: Transverse diameter of the prostate measures 6.2 cm. Other: Ventral hernia containing mesenteric fat. Musculoskeletal: No acute displaced fracture. Degenerative changes of the spine. IMPRESSION: Evidence of hollow viscus perforation with diffuse peritoneal contamination including free air and flocculent material, with either colonic or small bowel source possible. Gastric/Peri pyloric source is favored unlikely given the  absence of  sentinel air in the liver hilum. Surgical consultation is indicated. These above preliminary were discussed by telephone at the time of interpretation on 07/17/2020 at 9:55 am with Dr. Veryl Speak. Electronically Signed   By: Corrie Mckusick D.O.   On: 07/11/2020 10:33   DG Chest 2 View  Result Date: 07/08/2020 CLINICAL DATA:  Suspected sepsis EXAM: CHEST - 2 VIEW COMPARISON:  06/10/2020 FINDINGS: Heart size is enlarged as before. Increasing opacification in the retrocardiac region when compared to the prior study. Hilar structures with central pulmonary vascular engorgement. Mild increased interstitial markings some which may be chronic, baseline similar to the previous exam. No sign of pleural effusion. On limited assessment no acute skeletal process. IMPRESSION: 1. Increasing opacification in the retrocardiac region when compared to the prior study. Suspicious for developing pneumonia in the LEFT lower lobe. Differential includes volume loss. 2. Question of background early pulmonary edema. 3. Stable cardiomegaly. Electronically Signed   By: Zetta Bills M.D.   On: 07/08/2020 09:55    Microbiology Recent Results (from the past 240 hour(s))  Culture, blood (Routine x 2)     Status: None (Preliminary result)   Collection Time: 07/08/2020  9:06 AM   Specimen: BLOOD  Result Value Ref Range Status   Specimen Description BLOOD RIGHT ARM  Final   Special Requests   Final    BOTTLES DRAWN AEROBIC AND ANAEROBIC Blood Culture results may not be optimal due to an inadequate volume of blood received in culture bottles   Culture   Final    NO GROWTH 2 DAYS Performed at [facility], [address], [place], [place] [address]    Report Status PENDING  Incomplete  Culture, blood (Routine x 2)     Status: None (Preliminary result)   Collection Time: 07/04/2020  9:07 AM   Specimen: BLOOD  Result Value Ref Range Status   Specimen Description BLOOD LEFT ARM  Final   Special Requests   Final    BOTTLES DRAWN  AEROBIC AND ANAEROBIC Blood Culture adequate volume   Culture   Final    NO GROWTH 2 DAYS Performed at [facility], [address] [place], [place] [address]    Report Status PENDING  Incomplete  Resp Panel by RT-PCR (Flu A&B, Covid) Nasopharyngeal Swab     Status: None   Collection Time: 07/15/2020  9:07 AM   Specimen: Nasopharyngeal Swab; Nasopharyngeal(NP) swabs in vial transport medium  Result Value Ref Range Status   SARS Coronavirus 2 by RT PCR NEGATIVE NEGATIVE Final    Comment: (NOTE) SARS-CoV-2 target nucleic acids are NOT DETECTED.  The SARS-CoV-2 RNA is generally detectable in upper respiratory specimens during the acute phase of infection. The lowest concentration of SARS-CoV-2 viral copies this assay can detect is 138 copies/mL. A negative result does not preclude SARS-Cov-2 infection and should not be used as the sole basis for treatment or other patient management decisions. A negative result may occur with  improper specimen collection/handling, submission of specimen other than nasopharyngeal swab, presence of viral mutation(s) within the areas targeted by this assay, and inadequate number of viral copies(<138 copies/mL). A negative result must be combined with clinical observations, patient history, and epidemiological information. The expected result is Negative.  Fact Sheet for Patients:  EntrepreneurPulse.com.au  Fact Sheet for Healthcare Providers:  IncredibleEmployment.be  This test is no t yet approved or cleared by the [place] FDA and  has been authorized for detection and/or diagnosis of SARS-CoV-2 by  FDA under an Emergency Use Authorization (EUA). This EUA will remain  in effect (meaning this test can be used) for the duration of the COVID-19 declaration under Section 564(b)(1) of the Act, 21 U.S.C.section 360bbb-3(b)(1), unless the authorization is terminated  or revoked sooner.       Influenza A by PCR  NEGATIVE NEGATIVE Final   Influenza B by PCR NEGATIVE NEGATIVE Final    Comment: (NOTE) The Xpert Xpress SARS-CoV-2/FLU/RSV plus assay is intended as an aid in the diagnosis of influenza from Nasopharyngeal swab specimens and should not be used as a sole basis for treatment. Nasal washings and aspirates are unacceptable for Xpert Xpress SARS-CoV-2/FLU/RSV testing.  Fact Sheet for Patients: EntrepreneurPulse.com.au  Fact Sheet for Healthcare Providers: IncredibleEmployment.be  This test is not yet approved or cleared by the [place] FDA and has been authorized for detection and/or diagnosis of SARS-CoV-2 by FDA under an Emergency Use Authorization (EUA). This EUA will remain in effect (meaning this test can be used) for the duration of the COVID-19 declaration under Section 564(b)(1) of the Act, 21 U.S.C. section 360bbb-3(b)(1), unless the authorization is terminated or revoked.  Performed at [facility], [address] [place], [place] [address]     Lab Basic Metabolic Panel: Recent Labs  Lab 07/24/2020 0906  NA 134*  K 3.6  CL 97*  CO2 21*  GLUCOSE 104*  BUN 32*  CREATININE 1.70*  CALCIUM 8.8*   Liver Function Tests: Recent Labs  Lab 07/17/2020 0906  AST 48*  ALT 27  ALKPHOS 76  BILITOT 2.4*  PROT 6.8  ALBUMIN 3.8   No results for input(s): LIPASE, AMYLASE in the last 168 hours. No results for input(s): AMMONIA in the last 168 hours. CBC: Recent Labs  Lab 07/17/2020 0906  WBC 19.0*  NEUTROABS 16.9*  HGB 13.8  HCT 43.6  MCV 81.5  PLT 354   Cardiac Enzymes: No results for input(s): CKTOTAL, CKMB, CKMBINDEX, TROPONINI in the last 168 hours. Sepsis Labs: Recent Labs  Lab 07/25/2020 0906 07/17/2020 1121  WBC 19.0*  --   LATICACIDVEN 6.8* 5.2*    Procedures/Operations    Suraya Vidrine MD 07/25/20, 5:39 PM

## 2020-07-28 NOTE — TOC Progression Note (Addendum)
Transition of Care [facility]) - Progression Note    Patient Details  Name: ISABELLA IDA MRN: [id number] Date of Birth: 1920-06-13  Transition of [facility] CM/SW Contact  Natasha Bence, LCSW Phone Number: 07-24-2020, 10:38 AM  Clinical Narrative:    CSW notified of patient's request for home hospice referral. CSW contacted patient's room to discuss hospice referral. Patient's daughter notified CSW that the patient was requesting home hospices. Patient's family agreeable to utilize [facility]. CSW notified family that referral will be made to day, but hospice will likely not be able to follow up until Monday 07/19/2020. CSW contacted Hospice after hours number. Rep with the answering service reported that she was only given [place] hospice contacts. CSW notified rep that typically, CSW is able to make a referral with an on call nurse with home hospice and explained that [facility] would only be able to take residential referrals. Answering service rep not able to have hospice nurse follow up with CSW due to lack of contact info. CSW submitted hospice referral in [place] with Hospice to notify her of referral. [facility] not available on weekends. TOC will follow up with [facility] on Monday 07/19/2020. TOC to follow.   Addendum Patient and family now requesting Residential Hospice. CSW resubmitted referral for residential hospice. TOC to follow.       Expected Discharge Plan and Services                                                 Social Determinants of Health (SDOH) Interventions    Readmission Risk Interventions No flowsheet data found.

## 2020-07-28 NOTE — Progress Notes (Signed)
PROGRESS NOTE  Drew Reed AST:[id number] DOB: 09/12/1919 DOA: 07/23/2020 PCP: [facility]  Brief History:  86 y.o. male with medical history of paroxysmal atrial fibrillation, hypertension, systolic and diastolic CHF, hypothyroidism, BPH, cholecystitis status post cholecystotomy tube presenting with 2 to 3-day history of fevers, chills, abdominal pain, and generalized weakness.  There is been no history of chest pain, shortness of breath, vomiting, diarrhea, hematochezia, melena.  Most recently, the patient was admitted to the hospital from 06/10/2020 to 06/13/2020 for symptomatic anemia.  During that hospitalization the patient was transfused 1 unit PRBC as well as given IV iron.  He was also treated for acute on chronic systolic CHF.  He was discharged home in stable condition with home health.  He had been doing well up until 2 to 3 days prior to this admission when he began developing abdominal pain and fevers.  Because of his worsening pain, he presented for further evaluation.  In the emergency department, the patient had a fever up to 101.1 F.  He was hypotensive with a systolic blood pressure in the 80s.  Oxygen saturation was 100% on room air.  BMP showed sodium 134, potassium 3.6, serum creatinine 1.70.  WBC was 19.0, hemoglobin 13.8, platelets 354,000.  CT of the abdomen and pelvis showed free air and flocculent material within the root of the mesentery of the small bowel as well as free air extending superiorly to the right hemidiaphragm suggestive of a perforated viscus.  The patient was started on IV Zosyn and IV fluids.  General surgery was consulted and evaluated the patient.  However, the patient and spouse expressed that he did not want any surgical intervention.  After discussion with general surgery, the patient and spouse understood that without surgical intervention that his current medical illness represents a terminal condition from which he will ultimately  die regardless of medical therapy.  The patient and wife expressed understanding and wanted to pursue a more comfort directed approach toward care.  Assessment/Plan: Septic shock -Present on admission -Secondary to peritonitis from perforated viscus -Patient presented with leukocytosis and fever and hypotension with elevated lactic acid -Lactic acid 6.8>>5.2 -Continue Zosyn IV fluids for now -General surgery consulted -Patient and spouse expressed that did not want any surgical intervention with the understanding this will lead to his death -After discussion with family decision was made to start full comfort care measures.    Peritonitis -secondary to perforated viscus -CT as discussed above -spouse and patient have expressed that they did not want any operative intervention -they understand this is a terminal condition without surgery - Full comfort care measures starting 11/21.   Lactic acidosis -Secondary to sepsis and bowel perforation  Acute on chronic renal failure--CKD stage III -Secondary to sepsis and hemodynamic changes -Patient and family want to pursue more comfort care approach -Continue IV fluids for now>>stopped due to chest congestion  Chronic systolic and diastolic CHF -Patient had previous echo 05/29/13 with EF 45-50% -06/11/2020 echo EF 55% -Patient was on furosemide 20 mg daily at home -Patient and family wish to pursue comfort care  Paroxysmal atrial fibrillation -rate controlled -Patient did not want any anticoagulation -focus on comfort care  Hyponatremia -Depletion and poor solute intake  BPH -Previously on Flomax and finasteride  Hypothyroidism -Presently on Synthroid prior to admission  Iron deficiency Anemia -pt was on supplemental iron prior to admission  Goals of care -I had an extensive goals of care  discussion with the patient and spouse at bedside -DNR -11/21: family requested full comfort care measures, declined home  hospice  Status is: Inpatient  Remains inpatient appropriate because:IV treatments appropriate due to intensity of illness or inability to take PO  Dispo: The patient is from: Home              Anticipated d/c is to: In-hospital Death              Anticipated d/c date is: 2 days              Patient currently is not medically stable to d/c.  Family Communication:   Spouse and daughter-inlaw updated at bedside 11/21.  Family requested full comfort care measures and decided they wanted patient to remain in hospital.   Consultants:  General surgery  Code Status:  DNR  DVT Prophylaxis:  Comfort focused   Procedures: As Listed in Progress Note Above  Antibiotics: Zosyn 11/19>>11/21  Subjective: Patient adamant he wants to get out of the hospital and go home as soon as possible. No specific complaints.  He says his abdominal pain feels a little better today.   Objective: Vitals:   07/17/20 0521 07/17/20 1415 07/17/20 2033 30-Jul-2020 0500  BP: 107/64 109/69 (!) 126/56 123/89  Pulse: (!) 101 98 (!) 53 79  Resp: 16 16 18 18   Temp: (!) 97.5 F (36.4 C) 98 F (36.7 C) 97.9 F (36.6 C) 98.4 F (36.9 C)  TempSrc: Oral Oral  Oral  SpO2: 95% 90% 90% 95%  Weight:      Height:       No intake or output data in the 24 hours ending 07/30/20 1228 Weight change:  Exam:   General:  Pt awake, alert, no apparent distress, speaking full sentences.   HEENT: neck supple, no scleral icterus.   Cardiovascular: normal S1/S2 sounds, no rubs, no gallops  Respiratory: diminished BS RLL.   Abdomen: Soft/+BS, LLQ tenderness, non distended, no guarding, no masses palpated.   Extremities: no clubbing or cyanosis seen.  Neurological: nonfocal exam.    Data Reviewed: I have personally reviewed following labs and imaging studies Basic Metabolic Panel: Recent Labs  Lab 07/27/2020 0906  NA 134*  K 3.6  CL 97*  CO2 21*  GLUCOSE 104*  BUN 32*  CREATININE 1.70*  CALCIUM 8.8*   Liver  Function Tests: Recent Labs  Lab 07/10/2020 0906  AST 48*  ALT 27  ALKPHOS 76  BILITOT 2.4*  PROT 6.8  ALBUMIN 3.8   No results for input(s): LIPASE, AMYLASE in the last 168 hours. No results for input(s): AMMONIA in the last 168 hours. Coagulation Profile: Recent Labs  Lab 07/19/2020 0906  INR 1.4*   CBC: Recent Labs  Lab 07/06/2020 0906  WBC 19.0*  NEUTROABS 16.9*  HGB 13.8  HCT 43.6  MCV 81.5  PLT 354   Cardiac Enzymes: No results for input(s): CKTOTAL, CKMB, CKMBINDEX, TROPONINI in the last 168 hours. BNP: Invalid input(s): POCBNP CBG: No results for input(s): GLUCAP in the last 168 hours. HbA1C: No results for input(s): HGBA1C in the last 72 hours. Urine analysis:    Component Value Date/Time   COLORURINE AMBER (A) 07/23/2020 0851   APPEARANCEUR CLEAR 07/10/2020 0851   LABSPEC 1.018 07/11/2020 [place] 5.0 07/04/2020 [place] 07/01/2020 0851   HGBUR NEGATIVE 07/26/2020 [place] NEGATIVE 06/30/2020 0851   KETONESUR 5 (A) 07/03/2020 0851   PROTEINUR 30 (A) 07/06/2020  8299   UROBILINOGEN 0.2 05/28/2013 1125   NITRITE NEGATIVE 07/26/2020 [place] 07/13/2020 0851    Recent Results (from the past 240 hour(s))  Culture, blood (Routine x 2)     Status: None (Preliminary result)   Collection Time: 07/22/2020  9:06 AM   Specimen: BLOOD  Result Value Ref Range Status   Specimen Description BLOOD RIGHT ARM  Final   Special Requests   Final    BOTTLES DRAWN AEROBIC AND ANAEROBIC Blood Culture results may not be optimal due to an inadequate volume of blood received in culture bottles   Culture   Final    NO GROWTH 2 DAYS Performed at [facility], [address] [place], [place] [address]    Report Status PENDING  Incomplete  Culture, blood (Routine x 2)     Status: None (Preliminary result)   Collection Time: 06/28/2020  9:07 AM   Specimen: BLOOD  Result Value Ref Range Status   Specimen Description BLOOD LEFT  ARM  Final   Special Requests   Final    BOTTLES DRAWN AEROBIC AND ANAEROBIC Blood Culture adequate volume   Culture   Final    NO GROWTH 2 DAYS Performed at [facility], [address], [place], [place] [address]    Report Status PENDING  Incomplete  Resp Panel by RT-PCR (Flu A&B, Covid) Nasopharyngeal Swab     Status: None   Collection Time: 07/17/2020  9:07 AM   Specimen: Nasopharyngeal Swab; Nasopharyngeal(NP) swabs in vial transport medium  Result Value Ref Range Status   SARS Coronavirus 2 by RT PCR NEGATIVE NEGATIVE Final    Comment: (NOTE) SARS-CoV-2 target nucleic acids are NOT DETECTED.  The SARS-CoV-2 RNA is generally detectable in upper respiratory specimens during the acute phase of infection. The lowest concentration of SARS-CoV-2 viral copies this assay can detect is 138 copies/mL. A negative result does not preclude SARS-Cov-2 infection and should not be used as the sole basis for treatment or other patient management decisions. A negative result may occur with  improper specimen collection/handling, submission of specimen other than nasopharyngeal swab, presence of viral mutation(s) within the areas targeted by this assay, and inadequate number of viral copies(<138 copies/mL). A negative result must be combined with clinical observations, patient history, and epidemiological information. The expected result is Negative.  Fact Sheet for Patients:  EntrepreneurPulse.com.au  Fact Sheet for Healthcare Providers:  IncredibleEmployment.be  This test is no t yet approved or cleared by the [place] FDA and  has been authorized for detection and/or diagnosis of SARS-CoV-2 by FDA under an Emergency Use Authorization (EUA). This EUA will remain  in effect (meaning this test can be used) for the duration of the COVID-19 declaration under Section 564(b)(1) of the Act, 21 U.S.C.section 360bbb-3(b)(1), unless the authorization is  terminated  or revoked sooner.       Influenza A by PCR NEGATIVE NEGATIVE Final   Influenza B by PCR NEGATIVE NEGATIVE Final    Comment: (NOTE) The Xpert Xpress SARS-CoV-2/FLU/RSV plus assay is intended as an aid in the diagnosis of influenza from Nasopharyngeal swab specimens and should not be used as a sole basis for treatment. Nasal washings and aspirates are unacceptable for Xpert Xpress SARS-CoV-2/FLU/RSV testing.  Fact Sheet for Patients: EntrepreneurPulse.com.au  Fact Sheet for Healthcare Providers: IncredibleEmployment.be  This test is not yet approved or cleared by the [place] FDA and has been authorized for detection and/or diagnosis of SARS-CoV-2 by FDA under an Emergency  Use Authorization (EUA). This EUA will remain in effect (meaning this test can be used) for the duration of the COVID-19 declaration under Section 564(b)(1) of the Act, 21 U.S.C. section 360bbb-3(b)(1), unless the authorization is terminated or revoked.  Performed at [facility], [address] [place], [place] [address]      Scheduled Meds:  Continuous Infusions:   Procedures/Studies: CT ABDOMEN PELVIS WO CONTRAST  Result Date: 07/14/2020 CLINICAL DATA:  86 year old male with abdominal abscess and infection suspected EXAM: CT ABDOMEN AND PELVIS WITHOUT CONTRAST TECHNIQUE: Multidetector CT imaging of the abdomen and pelvis was performed following the standard protocol without IV contrast. COMPARISON:  06/11/2020 FINDINGS: Lower chest: Atelectatic changes at the lung bases. No acute finding. Hepatobiliary: Unremarkable appearance of liver. Gallbladder is decompressed. Minimal high density material within the dependent gallbladder. Pancreas: Relatively atrophic pancreas parenchyma. Spleen: Coarse calcifications within the spleen parenchyma compatible with prior granulomatous disease. Adrenals/Urinary Tract: - Right adrenal gland: Nodule in the lateral limb  of the right adrenal gland is unchanged from prior. - Left adrenal gland: Unremarkable. - Right kidney: None obstructing stone in the superior right kidney measures 3 mm. No hydronephrosis. Perinephric stranding within the fat. No focal lesion. - Left Kidney: No hydronephrosis, nephrolithiasis, inflammation, or ureteral dilation. No focal lesion. - Urinary Bladder: Urinary bladder partially distended. Impression on the bladder base from the prostate. Stomach/Bowel: The bowel is not well evaluated given the absence of IV contrast and PO contrast. Small hiatal hernia with distention of the stomach. Redemonstration of left inguinal hernia containing loops of small bowel with air-fluid level. Distended small bowel within the mid abdomen and low abdomen with air-fluid levels, new from the comparison. There is a combination of free air and flocculent material within the root of the small bowel mesentery, interposed between leaves of mesentery extending into both left and right abdomen and into the upper abdomen. Free air extends superiorly to the diaphragm on the right. Colon is relatively decompressed with minimal stool burden. Vascular/Lymphatic: Atherosclerotic changes of the abdominal aorta as well as the mesenteric, renal arteries, and iliac arteries. No adenopathy. Stranding within the small bowel mesentery. Reproductive: Transverse diameter of the prostate measures 6.2 cm. Other: Ventral hernia containing mesenteric fat. Musculoskeletal: No acute displaced fracture. Degenerative changes of the spine. IMPRESSION: Evidence of hollow viscus perforation with diffuse peritoneal contamination including free air and flocculent material, with either colonic or small bowel source possible. Gastric/Peri pyloric source is favored unlikely given the absence of sentinel air in the liver hilum. Surgical consultation is indicated. These above preliminary were discussed by telephone at the time of interpretation on 07/15/2020 at  9:55 am with Dr. Veryl Speak. Electronically Signed   By: Corrie Mckusick D.O.   On: 07/25/2020 10:33   DG Chest 2 View  Result Date: 07/10/2020 CLINICAL DATA:  Suspected sepsis EXAM: CHEST - 2 VIEW COMPARISON:  06/10/2020 FINDINGS: Heart size is enlarged as before. Increasing opacification in the retrocardiac region when compared to the prior study. Hilar structures with central pulmonary vascular engorgement. Mild increased interstitial markings some which may be chronic, baseline similar to the previous exam. No sign of pleural effusion. On limited assessment no acute skeletal process. IMPRESSION: 1. Increasing opacification in the retrocardiac region when compared to the prior study. Suspicious for developing pneumonia in the LEFT lower lobe. Differential includes volume loss. 2. Question of background early pulmonary edema. 3. Stable cardiomegaly. Electronically Signed   By: Zetta Bills M.D.   On: 06/29/2020 09:55  Irwin Brakeman, MD  How to contact the [facility] Attending or Consulting provider [place] or covering provider during after hours [place], for this patient?  1. Check the care team in [facility] and look for a) attending/consulting [facility] provider listed and b) the [facility] team listed 2. Log into [email or web address] and use [place]'s universal password to access. If you do not have the password, please contact the hospital operator. 3. Locate the [facility] provider you are looking for under [facility] and page to a number that you can be directly reached. 4. If you still have difficulty reaching the provider, please page the [facility] (Director on Call) for the Hospitalists listed on amion for assistance.   If 7PM-7AM, please contact night-coverage [email or web address] Password [facility] Aug 15, 2020, 12:28 PM   LOS: 2 days

## 2020-07-28 DEATH — deceased

## 2022-04-15 IMAGING — CT CT ABD-PELV W/O CM
2 of 4 series · 15 of 46 positions shown, 17 images · non-contrast
Comparison: 06/11/2020

CLINICAL DATA: [AGE] male with abdominal abscess and
infection suspected

EXAM:
CT ABDOMEN AND PELVIS WITHOUT CONTRAST
TECHNIQUE: Multidetector CT imaging of the abdomen and pelvis was performed
following the standard protocol without IV contrast.

[Series 2: axial st · axial · 0.82mm/px · z∈[+731,+1146]mm · 12 of 97 slices shown, 14 images]
[im 7/97  soft-tissue]
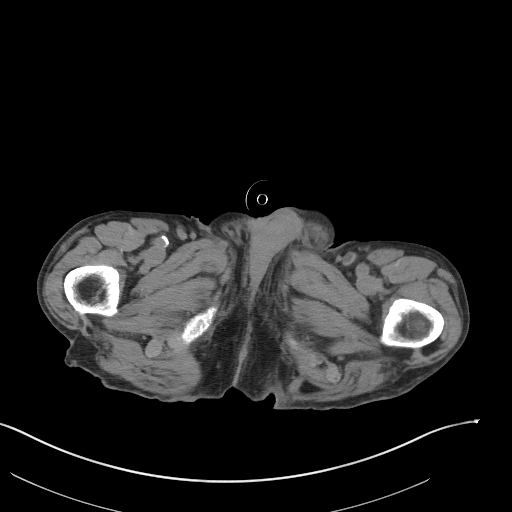
[im 7/97  bone]
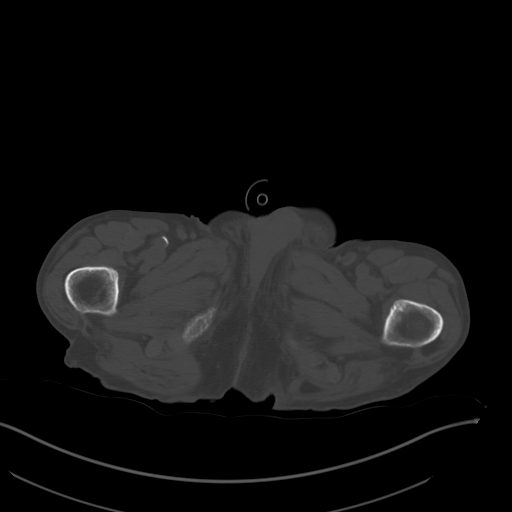
[im 13/97  soft-tissue]
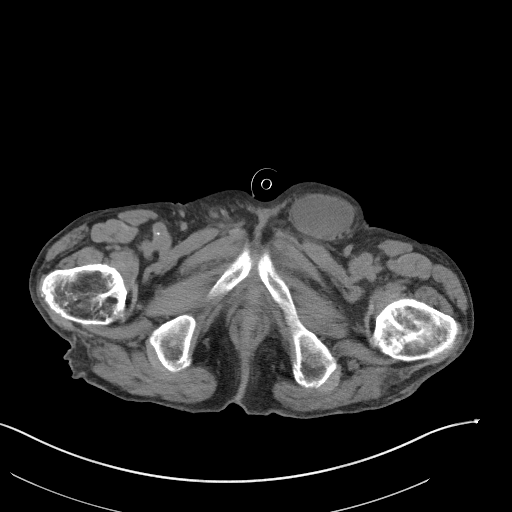
[im 20/97  soft-tissue]
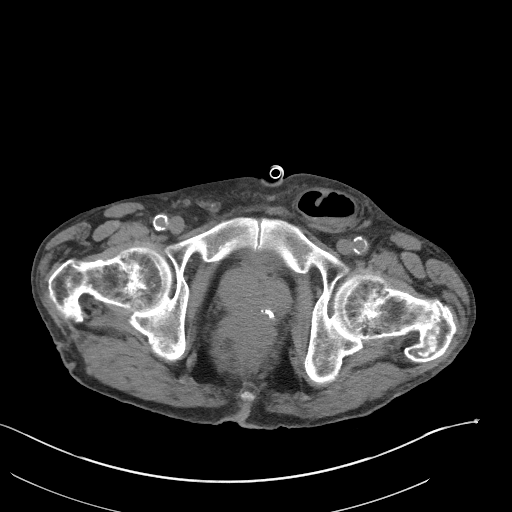
[im 33/97  soft-tissue]
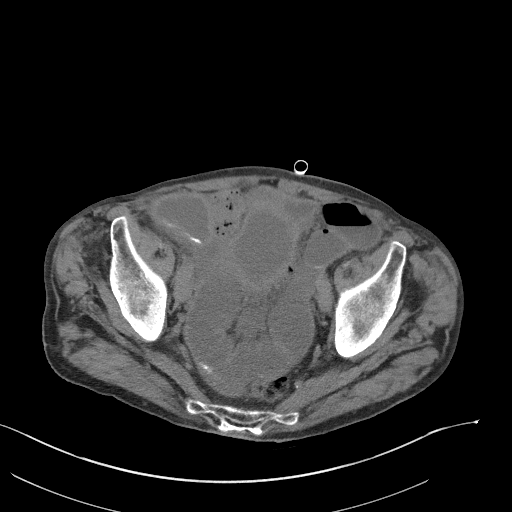
[im 39/97  soft-tissue]
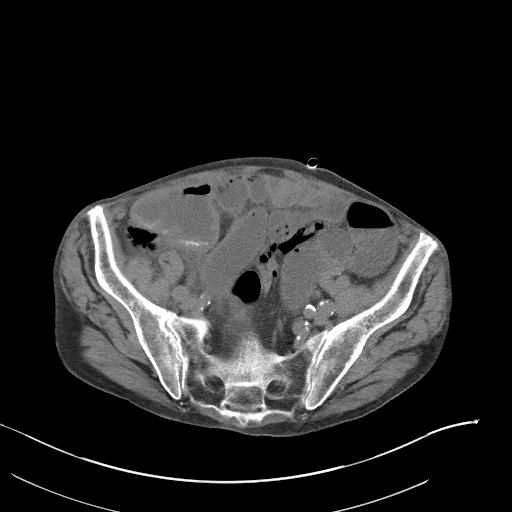
[im 45/97  soft-tissue]
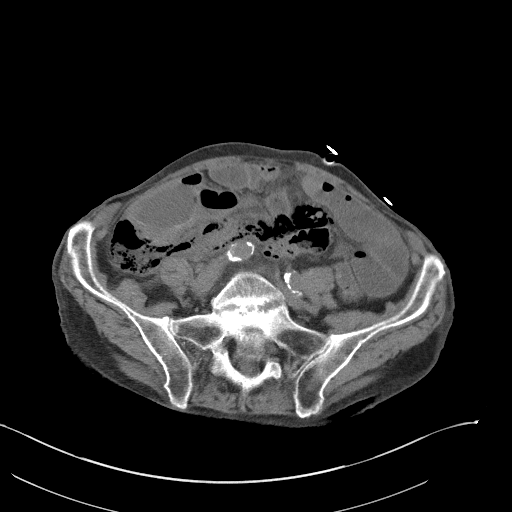
[im 52/97  soft-tissue]
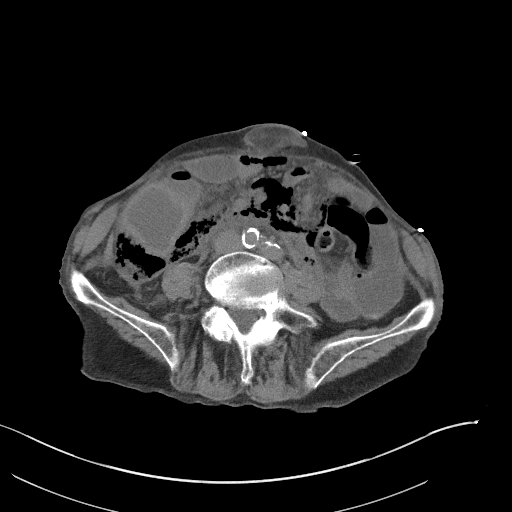
[im 58/97  soft-tissue]
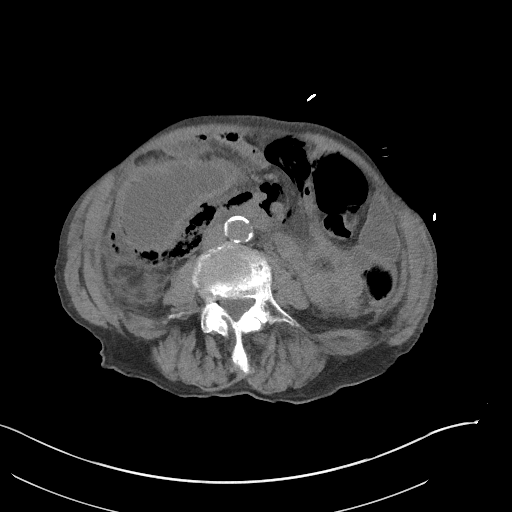
[im 65/97  soft-tissue]
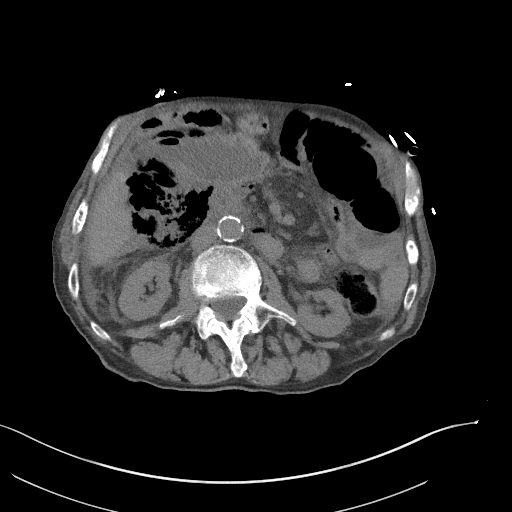
[im 65/97  bone]
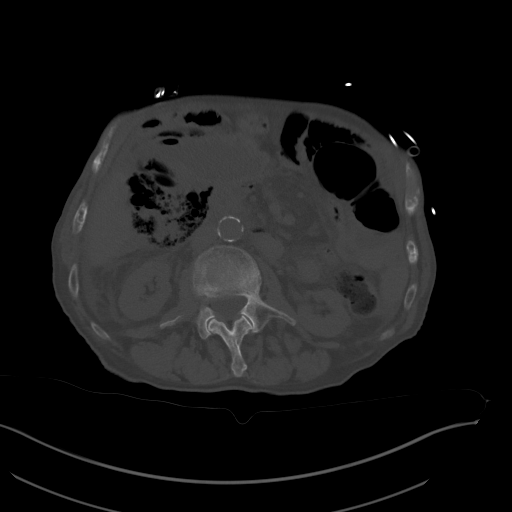
[im 77/97  soft-tissue]
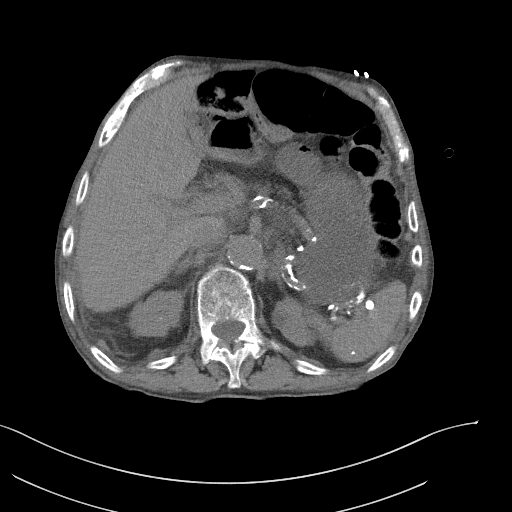
[im 84/97  soft-tissue]
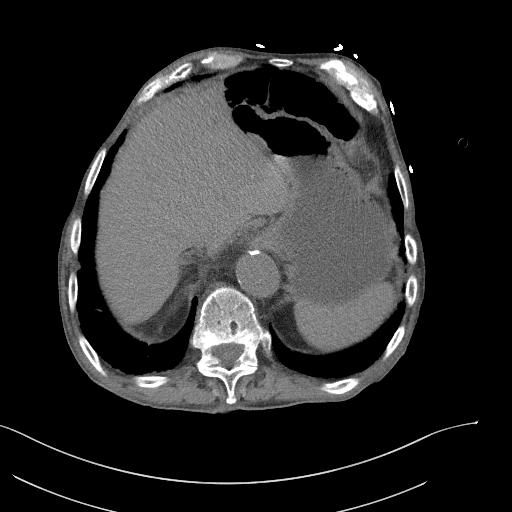
[im 90/97  soft-tissue]
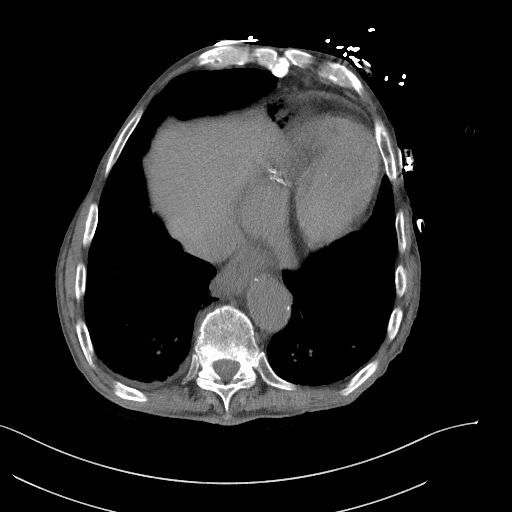

[Series 5: coronal st · coronal · 0.77mm/px · 3 of 115 slices shown]
[im 39/115  soft-tissue]
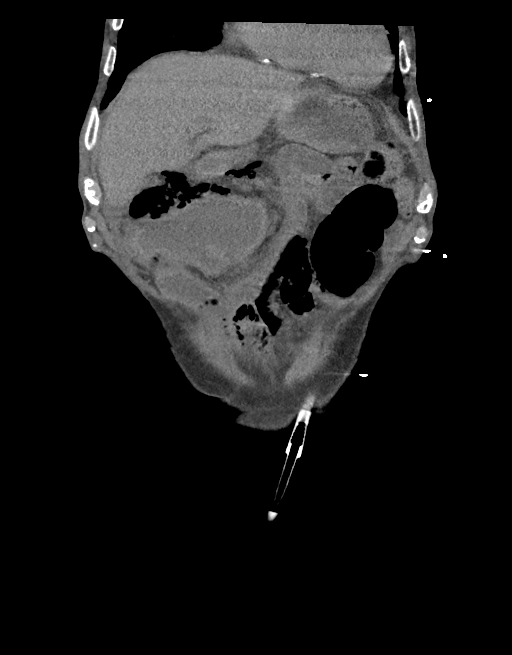
[im 51/115  soft-tissue]
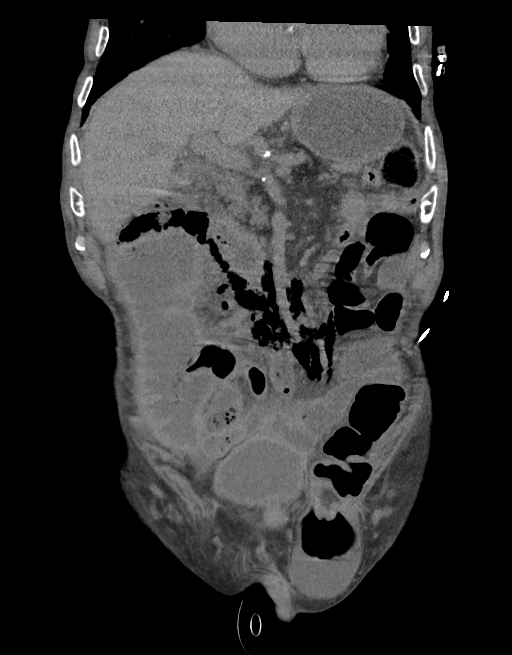
[im 64/115  soft-tissue]
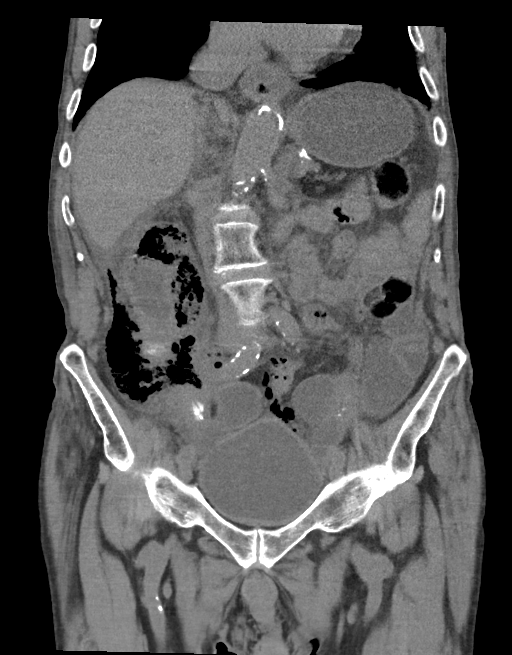

[15 of 46 positions shown; findings below may reference images not displayed]

FINDINGS: Lower chest: Atelectatic changes at the lung bases. No acute
finding.

Hepatobiliary: Unremarkable appearance of liver. Gallbladder is
decompressed. Minimal high density material within the dependent
gallbladder.

Pancreas: Relatively atrophic pancreas parenchyma.

Spleen: Coarse calcifications within the spleen parenchyma
compatible with prior granulomatous disease.

Adrenals/Urinary Tract:

- Right adrenal gland: Nodule in the lateral limb of the right
adrenal gland is unchanged from prior.

- Left adrenal gland: Unremarkable.

- Right kidney: None obstructing stone in the superior right kidney
measures 3 mm. No hydronephrosis. Perinephric stranding within the
fat. No focal lesion.

- Left Kidney: No hydronephrosis, nephrolithiasis, inflammation, or
ureteral dilation. No focal lesion.

- Urinary Bladder: Urinary bladder partially distended. Impression
on the bladder base from the prostate.

Stomach/Bowel: The bowel is not well evaluated given the absence of
IV contrast and PO contrast.

Small hiatal hernia with distention of the stomach.

Redemonstration of left inguinal hernia containing loops of small
bowel with air-fluid level. Distended small bowel within the mid
abdomen and low abdomen with air-fluid levels, new from the
comparison. There is a combination of free air and flocculent
material within the root of the small bowel mesentery, interposed
between leaves of mesentery extending into both left and right
abdomen and into the upper abdomen. Free air extends superiorly to
the diaphragm on the right.

Colon is relatively decompressed with minimal stool burden.

Vascular/Lymphatic: Atherosclerotic changes of the abdominal aorta
as well as the mesenteric, renal arteries, and iliac arteries.

No adenopathy.

Stranding within the small bowel mesentery.

Reproductive: Transverse diameter of the prostate measures 6.2 cm.

Other: Ventral hernia containing mesenteric fat.

Musculoskeletal: No acute displaced fracture. Degenerative changes
of the spine.
IMPRESSION: Evidence of hollow viscus perforation with diffuse peritoneal
contamination including free air and flocculent material, with
either colonic or small bowel source possible. Gastric/Peri pyloric
source is favored unlikely given the absence of sentinel air in the
liver hilum. Surgical consultation is indicated.

These above preliminary were discussed by telephone at the time of
interpretation on 07/16/2020 at [DATE] with Dr. KLAJDISON KE.
